# Patient Record
Sex: Male | Born: 1975 | Race: Black or African American | Hispanic: No | Marital: Single | Smoking: Current every day smoker
Health system: Southern US, Community
[De-identification: ages and names within clinical notes are randomized; demographics above are authoritative.]

## PROBLEM LIST (undated history)

## (undated) DIAGNOSIS — E119 Type 2 diabetes mellitus without complications: Secondary | ICD-10-CM

## (undated) DIAGNOSIS — I10 Essential (primary) hypertension: Secondary | ICD-10-CM

## (undated) HISTORY — DX: Essential (primary) hypertension: I10

## (undated) HISTORY — DX: Type 2 diabetes mellitus without complications: E11.9

---

## 2005-10-15 ENCOUNTER — Emergency Department: Payer: Self-pay | Admitting: Emergency Medicine

## 2005-10-15 ENCOUNTER — Other Ambulatory Visit: Payer: Self-pay

## 2008-05-06 DIAGNOSIS — E1159 Type 2 diabetes mellitus with other circulatory complications: Secondary | ICD-10-CM | POA: Insufficient documentation

## 2008-09-26 ENCOUNTER — Ambulatory Visit: Payer: Self-pay | Admitting: Family Medicine

## 2011-08-26 ENCOUNTER — Ambulatory Visit: Payer: Self-pay | Admitting: Family Medicine

## 2013-01-29 ENCOUNTER — Emergency Department: Payer: Self-pay | Admitting: Emergency Medicine

## 2014-03-17 ENCOUNTER — Ambulatory Visit: Payer: Self-pay | Admitting: Family Medicine
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
## 2015-06-13 ENCOUNTER — Other Ambulatory Visit: Payer: Self-pay | Admitting: Family Medicine

## 2015-06-13 ENCOUNTER — Telehealth: Payer: Self-pay | Admitting: Family Medicine

## 2015-06-13 DIAGNOSIS — I10 Essential (primary) hypertension: Secondary | ICD-10-CM

## 2015-06-13 MED ORDER — CHLORTHALIDONE 25 MG PO TABS: 25.0000 mg | ORAL_TABLET | Freq: Every day | ORAL | Status: DC

## 2015-06-13 NOTE — Telephone Encounter (Signed)
Patient is out of chlorthalidone but cannot come in until Friday, July 8.   Says he cannot go 1 week without his meds.    [facility] [place]

## 2015-07-07 ENCOUNTER — Encounter: Payer: Self-pay | Admitting: Family Medicine

## 2015-07-07 ENCOUNTER — Ambulatory Visit (INDEPENDENT_AMBULATORY_CARE_PROVIDER_SITE_OTHER): Payer: No Typology Code available for payment source | Admitting: Family Medicine

## 2015-07-07 VITALS — BP 120/88 | HR 84 | Temp 98.6°F | Resp 16 | Ht 72.0 in | Wt 284.8 lb

## 2015-07-07 DIAGNOSIS — R739 Hyperglycemia, unspecified: Secondary | ICD-10-CM

## 2015-07-07 DIAGNOSIS — E119 Type 2 diabetes mellitus without complications: Secondary | ICD-10-CM | POA: Diagnosis not present

## 2015-07-07 DIAGNOSIS — M2550 Pain in unspecified joint: Secondary | ICD-10-CM

## 2015-07-07 DIAGNOSIS — R519 Headache, unspecified: Secondary | ICD-10-CM | POA: Insufficient documentation

## 2015-07-07 DIAGNOSIS — F4321 Adjustment disorder with depressed mood: Secondary | ICD-10-CM | POA: Diagnosis not present

## 2015-07-07 DIAGNOSIS — Z13220 Encounter for screening for lipoid disorders: Secondary | ICD-10-CM

## 2015-07-07 DIAGNOSIS — Z22322 Carrier or suspected carrier of Methicillin resistant Staphylococcus aureus: Secondary | ICD-10-CM | POA: Insufficient documentation

## 2015-07-07 DIAGNOSIS — R51 Headache: Secondary | ICD-10-CM

## 2015-07-07 DIAGNOSIS — I10 Essential (primary) hypertension: Secondary | ICD-10-CM

## 2015-07-07 DIAGNOSIS — E1165 Type 2 diabetes mellitus with hyperglycemia: Secondary | ICD-10-CM | POA: Insufficient documentation

## 2015-07-07 LAB — POCT GLYCOSYLATED HEMOGLOBIN (HGB A1C): Hemoglobin A1C: 7.1

## 2015-07-07 MED ORDER — METFORMIN HCL 500 MG PO TABS: 500.0000 mg | ORAL_TABLET | Freq: Two times a day (BID) | ORAL | Status: DC

## 2015-07-07 MED ORDER — LISINOPRIL 20 MG PO TABS: 20.0000 mg | ORAL_TABLET | Freq: Every day | ORAL | Status: DC

## 2015-07-07 MED ORDER — IBUPROFEN 800 MG PO TABS: ORAL_TABLET | ORAL | Status: DC

## 2015-07-07 MED ORDER — CHLORTHALIDONE 25 MG PO TABS: 25.0000 mg | ORAL_TABLET | Freq: Every day | ORAL | Status: DC

## 2015-07-07 MED ORDER — SERTRALINE HCL 50 MG PO TABS: 50.0000 mg | ORAL_TABLET | Freq: Every day | ORAL | Status: DC

## 2015-07-07 NOTE — Patient Instructions (Signed)
Start with metformin first for the first few days then add sertraline. Lab slip is down in the lab for when you are ready to get fasting labs.

## 2015-07-07 NOTE — Progress Notes (Signed)
Subjective:     Patient ID: Melvin Mccormick, male   DOB: [birth year], 39 y.o.   MRN: [id number]  HPI  Chief Complaint  Patient presents with  . Hypertension    Follow up from 03/03/14, restarted patient on Chlorthalidone  po qd and Lisinopril . Blood pressure in house at last visit was 152/88.  . Diabetes    Follow up from 03/03/14 HgbA1C in house was 6.5% recommend Metformin BID  States his grandfather died 4 weeks ago and he is grieving. Also states he is stressed from work as he has little time off. Reports increased irritability.   Review of Systems  Constitutional: Positive for unexpected weight change (16# since January 2016 visit).  Respiratory: Negative for shortness of breath.   Cardiovascular: Negative for chest pain and palpitations.  Endocrine:       A1C at office visit last year 6.5%. Patient wished to try Lifestyle changes first.       Objective:   Physical Exam  Constitutional: He appears well-developed and well-nourished. He appears distressed (sad and irritable).  Cardiovascular: Normal rate and regular rhythm.   Pulmonary/Chest: Breath sounds normal.  Musculoskeletal: He exhibits no edema (in lower extremities).       Assessment:    1. Benign essential HTN - lisinopril (PRINIVIL,ZESTRIL) 20 MG tablet; Take 1 tablet (20 mg total) by mouth daily.  Dispense: 90 tablet; Refill: 3 - chlorthalidone (HYGROTON) 25 MG tablet; Take 1 tablet (25 mg total) by mouth daily.  Dispense: 90 tablet; Refill: 3 - Comprehensive metabolic panel  2. Blood glucose elevated - POCT glycosylated hemoglobin (Hb A1C)  3. Screening for cholesterol level - Lipid panel  4. Adjustment disorder with depressed mood - sertraline (ZOLOFT) 50 MG tablet; Take 1 tablet (50 mg total) by mouth daily. Take 1/2 pill for the first 5 days  Dispense: 30 tablet; Refill: 1  5. Joint pain: due to heavy lifting at work  ibuprofen (ADVIL,MOTRIN) 800 MG tablet; 3 X DAILY WITH A MEAL AS NEEDED FOR  JOINT PAIN  Dispense: 30 tablet; Refill: 1  6.Type 2 diabetes mellitus without complication - metFORMIN (GLUCOPHAGE) 500 MG tablet; Take 1 tablet (500 mg total) by mouth 2 (two) times daily with a meal.  Dispense: 60 tablet; Refill: 1     Plan:    Return in 4 weeks. Discussed starting metformin first then sertraline.

## 2015-07-14 ENCOUNTER — Ambulatory Visit: Payer: Self-pay | Admitting: Family Medicine

## 2015-07-28 ENCOUNTER — Ambulatory Visit: Payer: No Typology Code available for payment source | Admitting: Family Medicine

## 2015-08-28 ENCOUNTER — Telehealth: Payer: Self-pay | Admitting: Family Medicine

## 2015-08-28 NOTE — Telephone Encounter (Signed)
Patient called in stating that since he has started Metformin he has had lower back pain around his kidney area.    He wants to d/c this for 4 days to see if this is the cause of pain.    Please advise pt.

## 2015-08-28 NOTE — Telephone Encounter (Signed)
He can try that but I think it is unlikely to be the cause of his back pain.

## 2015-08-28 NOTE — Telephone Encounter (Signed)
Unable to reach patient phone number is disconnected, unable to find another number in chart. KW

## 2015-09-19 ENCOUNTER — Other Ambulatory Visit: Payer: Self-pay | Admitting: Family Medicine

## 2015-12-14 ENCOUNTER — Other Ambulatory Visit: Payer: Self-pay | Admitting: Family Medicine

## 2015-12-14 ENCOUNTER — Telehealth: Payer: Self-pay | Admitting: Family Medicine

## 2015-12-14 DIAGNOSIS — J019 Acute sinusitis, unspecified: Secondary | ICD-10-CM

## 2015-12-14 MED ORDER — AMOXICILLIN-POT CLAVULANATE 875-125 MG PO TABS: 1.0000 | ORAL_TABLET | Freq: Two times a day (BID) | ORAL | Status: DC

## 2015-12-14 NOTE — Telephone Encounter (Signed)
If bp under control may use Mucinex D. For cough: Delsym. I don't usually call in antibiotics without seeing the patient but will make an exception this time. Have sent in [facility] to [facility] to cover for possible sinus infection. If not improving will need to see us or go to  Urgent Care.

## 2015-12-14 NOTE — Telephone Encounter (Signed)
Patient called in stating he has had a sore throat X 2 weeks, nasal congestion, feels bad.  Been taking Alka Seltzer and nyquil at night with no relief.  Patient's insurance has changed to where he has to pay $5000.00 out of pocket and cannot afford to come in.   Please call him with advice.  [phone].

## 2015-12-14 NOTE — Telephone Encounter (Signed)
Patient advised as directed below. Patient verbalized understanding.  

## 2016-05-10 ENCOUNTER — Ambulatory Visit (INDEPENDENT_AMBULATORY_CARE_PROVIDER_SITE_OTHER): Payer: 59 | Admitting: Family Medicine

## 2016-05-10 ENCOUNTER — Encounter: Payer: Self-pay | Admitting: Family Medicine

## 2016-05-10 VITALS — BP 142/98 | HR 99 | Temp 98.2°F | Resp 18 | Wt 293.0 lb

## 2016-05-10 DIAGNOSIS — R05 Cough: Secondary | ICD-10-CM

## 2016-05-10 DIAGNOSIS — R12 Heartburn: Secondary | ICD-10-CM | POA: Diagnosis not present

## 2016-05-10 DIAGNOSIS — R059 Cough, unspecified: Secondary | ICD-10-CM

## 2016-05-10 NOTE — Patient Instructions (Signed)
Start Dexilant (#10) in the morning. In the evening take an antacid combined with Pepcid (famotidine). Followup when you are better for your blood pressure and sugar check.

## 2016-05-10 NOTE — Progress Notes (Signed)
Subjective:     Patient ID: Melvin Mccormick, male   DOB: 01/09/1976, 40 y.o.   MRN: [id number]  HPI  Chief Complaint  Patient presents with  . Cough    for about 4 days. sometimes productive  States he developed cough over the last 5 days in the absence of cold or allergy sx. No fever or chills nor does he feels sick. States cough makes his throat sore-coughing both day and night. Reports he continues to use smokeless tobacco and had heartburn last week. Weight is up 10# since office visit in July. Reports he is no longer on metformin and changed his eating habits.   Review of Systems     Objective:   Physical Exam  Constitutional: He appears well-developed and well-nourished.  Ears: T.M's intact without inflammation Throat: no tonsillar enlargement or exudate Neck: no cervical adenopathy Lungs: clear     Assessment:    1. Cough: ? Mediated by nocturnal reflux.  2. Heartburn    Plan:   Start Dexilant 60 mg. (#10) in the AM and Pepcid/antacid at bedtime. F/u of elevated sugar and bp when better from current sx.

## 2016-07-21 ENCOUNTER — Other Ambulatory Visit: Payer: Self-pay | Admitting: Family Medicine

## 2016-07-21 DIAGNOSIS — I10 Essential (primary) hypertension: Secondary | ICD-10-CM

## 2016-11-18 ENCOUNTER — Other Ambulatory Visit: Payer: Self-pay | Admitting: Family Medicine

## 2016-11-18 DIAGNOSIS — I10 Essential (primary) hypertension: Secondary | ICD-10-CM

## 2017-09-01 ENCOUNTER — Telehealth: Payer: Self-pay | Admitting: Family Medicine

## 2017-09-01 NOTE — Telephone Encounter (Signed)
Mr. Siegel called wanting to get an appt. With you for DM and HBP.    He said he knew you would want labs done also.  He does not have ins. And wanted to know the cost of the labs before he came in.  Can you tell me what labs you would initially order so I can give him the price or an estimated amount?

## 2017-09-01 NOTE — Telephone Encounter (Signed)
I would do an in-house A1C and a lab slip for Comprehensive metabolic profile and lipid profile.

## 2017-09-02 NOTE — Telephone Encounter (Signed)
Patient advised.

## 2017-09-04 ENCOUNTER — Ambulatory Visit (INDEPENDENT_AMBULATORY_CARE_PROVIDER_SITE_OTHER): Payer: Self-pay | Admitting: Family Medicine

## 2017-09-04 ENCOUNTER — Encounter: Payer: Self-pay | Admitting: Family Medicine

## 2017-09-04 VITALS — BP 190/130 | HR 89 | Temp 98.2°F | Resp 17 | Wt 288.8 lb

## 2017-09-04 DIAGNOSIS — I10 Essential (primary) hypertension: Secondary | ICD-10-CM

## 2017-09-04 DIAGNOSIS — E119 Type 2 diabetes mellitus without complications: Secondary | ICD-10-CM

## 2017-09-04 LAB — LIPID PANEL
CHOL/HDL RATIO: 5 (calc) — AB (ref <5.0)
Cholesterol: 119 mg/dL (ref <200)
HDL: 24 mg/dL — AB (ref >40)
LDL Cholesterol (Calc): 66 mg/dL (calc)
NON-HDL CHOLESTEROL (CALC): 95 mg/dL (ref <130)
TRIGLYCERIDES: 233 mg/dL — AB (ref <150)

## 2017-09-04 LAB — COMPREHENSIVE METABOLIC PANEL
AG Ratio: 1.4 (calc) (ref 1.0–2.5)
ALT: 34 U/L (ref 9–46)
AST: 19 U/L (ref 10–40)
Albumin: 4.1 g/dL (ref 3.6–5.1)
Alkaline phosphatase (APISO): 89 U/L (ref 40–115)
BUN: 15 mg/dL (ref 7–25)
CO2: 26 mmol/L (ref 20–32)
CREATININE: 1.01 mg/dL (ref 0.60–1.35)
Calcium: 9.3 mg/dL (ref 8.6–10.3)
Chloride: 100 mmol/L (ref 98–110)
GLUCOSE: 254 mg/dL — AB (ref 65–99)
Globulin: 2.9 g/dL (calc) (ref 1.9–3.7)
Potassium: 4.4 mmol/L (ref 3.5–5.3)
Sodium: 136 mmol/L (ref 135–146)
Total Bilirubin: 0.4 mg/dL (ref 0.2–1.2)
Total Protein: 7 g/dL (ref 6.1–8.1)

## 2017-09-04 LAB — POCT GLYCOSYLATED HEMOGLOBIN (HGB A1C): Hemoglobin A1C: 8.8

## 2017-09-04 MED ORDER — LISINOPRIL 20 MG PO TABS: 20.0000 mg | ORAL_TABLET | Freq: Every day | ORAL | 0 refills | Status: DC

## 2017-09-04 MED ORDER — CHLORTHALIDONE 25 MG PO TABS: 25.0000 mg | ORAL_TABLET | Freq: Every day | ORAL | 1 refills | Status: DC

## 2017-09-04 MED ORDER — GLIPIZIDE 5 MG PO TABS: ORAL_TABLET | ORAL | 1 refills | Status: DC

## 2017-09-04 NOTE — Addendum Note (Signed)
Addended by: Jaclyn Prime on: 09/04/2017 09:01 AM   Modules accepted: Orders

## 2017-09-04 NOTE — Patient Instructions (Addendum)
Let me know if you can't tolerate the medications. Please return in two weeks. We will call you with the lab results.

## 2017-09-04 NOTE — Progress Notes (Addendum)
Subjective:     Patient ID: Melvin Mccormick, male   DOB: 1976-07-23, 41 y.o.   MRN: [id number]  HPI  Chief Complaint  Patient presents with  . Hypertension    Patient returns for follow up visit from 07/07/15 blood pressure at [facility] was 120/88, patient was advised to continue Lisinopril  and Chlorthalidone  qd. Patient reports poor compliance on medication,  . Hyperglycemia    Follow up from 07/07/15, HgbA1C was 7.1%, patient reports poor diet and exercise.   States except for his toes tingling has not had any diabetic sx. 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 He reports he has no health insurance and has financial stress trying to build his business. Did not call for refill of his bp medications. States metformin made him feel "loopy" and "drugged up".   Review of Systems  Respiratory: Negative for shortness of breath.   Cardiovascular: Negative for chest pain and palpitations.  Neurological: Negative for headaches.       Objective:   Physical Exam  Constitutional: He appears well-nourished. No distress.  Cardiovascular: Normal rate and regular rhythm.   Pulmonary/Chest: Breath sounds normal.  Musculoskeletal: He exhibits no edema (of lower extremities).       Assessment:    1. Diabetes mellitus without complication (HCC) - glipiZIDE (GLUCOTROL) 5 MG tablet; Take one tablet 30 minutes before the two biggest meals of the day  Dispense: 60 tablet; Refill: 1 - Lipid panel -POCT A1C 2. Benign essential HTN - lisinopril (PRINIVIL,ZESTRIL) 20 MG tablet; Take 1 tablet (20 mg total) by mouth daily.  Dispense: 30 tablet; Refill: 0 - chlorthalidone (HYGROTON) 25 MG tablet; Take 1 tablet (25 mg total) by mouth daily.  Dispense: 30 tablet; Refill: 1 - Comprehensive metabolic panel    Plan:    Further f/u in two weeks and pending lab work.

## 2017-09-05 ENCOUNTER — Telehealth: Payer: Self-pay

## 2017-09-05 NOTE — Telephone Encounter (Signed)
-----   Message from Anola Gurney, [place] sent at 09/05/2017  7:31 AM EDT ----- Your sugar is high as we discussed but the rest of your labs are pretty good. We can discuss further at next office visit.

## 2017-09-05 NOTE — Telephone Encounter (Signed)
lmtcb

## 2017-09-11 NOTE — Telephone Encounter (Signed)
Patient advised.KW 

## 2017-09-25 ENCOUNTER — Encounter: Payer: Self-pay | Admitting: Family Medicine

## 2017-09-25 ENCOUNTER — Ambulatory Visit (INDEPENDENT_AMBULATORY_CARE_PROVIDER_SITE_OTHER): Payer: Self-pay | Admitting: Family Medicine

## 2017-09-25 VITALS — BP 132/104 | HR 99 | Temp 98.1°F | Resp 16 | Wt 283.8 lb

## 2017-09-25 DIAGNOSIS — E119 Type 2 diabetes mellitus without complications: Secondary | ICD-10-CM

## 2017-09-25 DIAGNOSIS — I10 Essential (primary) hypertension: Secondary | ICD-10-CM

## 2017-09-25 LAB — GLUCOSE, POCT (MANUAL RESULT ENTRY): POC GLUCOSE: 208 mg/dL — AB (ref 70–99)

## 2017-09-25 MED ORDER — LISINOPRIL 20 MG PO TABS: 20.0000 mg | ORAL_TABLET | Freq: Every day | ORAL | 5 refills | Status: DC

## 2017-09-25 MED ORDER — GLIPIZIDE ER 10 MG PO TB24: 10.0000 mg | ORAL_TABLET | Freq: Every day | ORAL | 1 refills | Status: DC

## 2017-09-25 MED ORDER — CHLORTHALIDONE 25 MG PO TABS: 25.0000 mg | ORAL_TABLET | Freq: Every day | ORAL | 5 refills | Status: DC

## 2017-09-25 NOTE — Progress Notes (Signed)
Subjective:     Patient ID: Melvin Mccormick, male   DOB: 30-Aug-1976, 41 y.o.   MRN: [id number]  HPI  Chief Complaint  Patient presents with  . Diabetes    Patient returns for 2 week follow up from 09/04/17 HgbA1C was 8.8% and patient was advised to continue Glipizide . Patient reports that he has been working on dietary changes and has good compliance on medication.  . Hypertension    Patient returns for 2 week follow up last office visit was 09/04/17 and blood pressure in house was 190/130. Patient has continued Lisinopril  and continue Chlorthalidone , patient reports fair compliance and good tolerance on medication.   States he has trouble taking both doses of glipizide due to his schedule. Wishes to try once daily dosing. States he has not eaten today yet. States he is still adjusting to the break up of a 12 year relationship. Denies suicidal plan.   Review of Systems     Objective:   Physical Exam  Constitutional: He appears well-developed and well-nourished. No distress.  Cardiovascular: Normal rate and regular rhythm.   Pulmonary/Chest: Breath sounds normal.       Assessment:    1. Diabetes mellitus without complication [facility]): will change to XL version for better compliance - POCT glucose (manual entry) - glipiZIDE (GLUCOTROL XL) 10 MG 24 hr tablet; Take 1 tablet (10 mg total) by mouth daily with breakfast. Take one pill 30 minutes before the biggest meal of the day  Dispense: 30 tablet; Refill: 1  2. Benign essential HTN: will allow current dose of medication a few more weeks before adding additional medication. - lisinopril (PRINIVIL,ZESTRIL) 20 MG tablet; Take 1 tablet (20 mg total) by mouth daily.  Dispense: 30 tablet; Refill: 5 - chlorthalidone (HYGROTON) 25 MG tablet; Take 1 tablet (25 mg total) by mouth daily.  Dispense: 30 tablet; Refill: 5    Plan:    Discussed walking program and taking diabetes medication 30 minutes before the biggest meal of the day.

## 2017-09-25 NOTE — Patient Instructions (Signed)
Discussed walking 30 minutes daily. Take the diabetes pill 30 minutes before the biggest meal of the day.

## 2017-10-16 ENCOUNTER — Ambulatory Visit: Payer: Self-pay | Admitting: Family Medicine

## 2017-10-27 ENCOUNTER — Other Ambulatory Visit: Payer: Self-pay | Admitting: Family Medicine

## 2017-10-27 DIAGNOSIS — E119 Type 2 diabetes mellitus without complications: Secondary | ICD-10-CM

## 2017-10-30 ENCOUNTER — Ambulatory Visit: Payer: Self-pay | Admitting: Family Medicine

## 2017-11-03 ENCOUNTER — Other Ambulatory Visit: Payer: Self-pay | Admitting: Family Medicine

## 2017-11-03 ENCOUNTER — Telehealth: Payer: Self-pay | Admitting: Family Medicine

## 2017-11-03 DIAGNOSIS — E119 Type 2 diabetes mellitus without complications: Secondary | ICD-10-CM

## 2017-11-03 MED ORDER — GLIPIZIDE ER 10 MG PO TB24: 10.0000 mg | ORAL_TABLET | Freq: Every day | ORAL | 2 refills | Status: DC

## 2017-11-03 NOTE — Telephone Encounter (Signed)
refilled 

## 2017-11-03 NOTE — Telephone Encounter (Signed)
Please review. Thanks!  

## 2017-11-03 NOTE — Telephone Encounter (Signed)
[facility] [address] [place] faxed refill request for following medications: glipiZIDE (GLUCOTROL XL) 10 MG 24 hr tablet    Please advise,Thanks 

## 2017-11-04 ENCOUNTER — Encounter: Payer: Self-pay | Admitting: Family Medicine

## 2017-11-04 ENCOUNTER — Ambulatory Visit: Payer: Self-pay | Admitting: Family Medicine

## 2017-11-04 VITALS — BP 134/88 | HR 99 | Temp 98.6°F | Resp 16 | Wt 287.4 lb

## 2017-11-04 DIAGNOSIS — M25511 Pain in right shoulder: Secondary | ICD-10-CM

## 2017-11-04 DIAGNOSIS — E119 Type 2 diabetes mellitus without complications: Secondary | ICD-10-CM

## 2017-11-04 DIAGNOSIS — G8929 Other chronic pain: Secondary | ICD-10-CM

## 2017-11-04 DIAGNOSIS — I10 Essential (primary) hypertension: Secondary | ICD-10-CM

## 2017-11-04 LAB — GLUCOSE, POCT (MANUAL RESULT ENTRY): POC Glucose: 260 mg/dl — AB (ref 70–99)

## 2017-11-04 MED ORDER — SITAGLIPTIN PHOSPHATE 100 MG PO TABS: 100.0000 mg | ORAL_TABLET | Freq: Every day | ORAL | 1 refills | Status: DC

## 2017-11-04 NOTE — Patient Instructions (Signed)
Try the Good Rx card for the best prices on medication.
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
## 2017-12-05 ENCOUNTER — Ambulatory Visit: Payer: Self-pay | Admitting: Family Medicine

## 2017-12-31 ENCOUNTER — Ambulatory Visit: Payer: Self-pay | Admitting: Family Medicine

## 2018-02-26 ENCOUNTER — Other Ambulatory Visit: Payer: Self-pay | Admitting: Family Medicine

## 2018-02-26 ENCOUNTER — Telehealth: Payer: Self-pay | Admitting: Family Medicine

## 2018-02-26 DIAGNOSIS — E119 Type 2 diabetes mellitus without complications: Secondary | ICD-10-CM

## 2018-02-26 DIAGNOSIS — I10 Essential (primary) hypertension: Secondary | ICD-10-CM

## 2018-02-26 MED ORDER — CHLORTHALIDONE 25 MG PO TABS
25.0000 mg | ORAL_TABLET | Freq: Every day | ORAL | 5 refills | Status: DC
Start: 2018-02-26 — End: 2018-10-12

## 2018-02-26 MED ORDER — GLIPIZIDE ER 10 MG PO TB24: 10.0000 mg | ORAL_TABLET | Freq: Every day | ORAL | 2 refills | Status: DC

## 2018-02-26 MED ORDER — LISINOPRIL 20 MG PO TABS: 20.0000 mg | ORAL_TABLET | Freq: Every day | ORAL | 5 refills | Status: DC

## 2018-02-26 NOTE — Telephone Encounter (Signed)
done

## 2018-02-26 NOTE — Telephone Encounter (Signed)
Patient needs refills on Glipizide 10 mg. HCTZ 25 mg., Lisinopril 20 mg. [facility] [place]

## 2018-05-05 ENCOUNTER — Other Ambulatory Visit: Payer: Self-pay | Admitting: Family Medicine

## 2018-05-05 ENCOUNTER — Telehealth: Payer: Self-pay | Admitting: Family Medicine

## 2018-05-05 DIAGNOSIS — I10 Essential (primary) hypertension: Secondary | ICD-10-CM

## 2018-05-05 MED ORDER — LISINOPRIL 20 MG PO TABS: 20.0000 mg | ORAL_TABLET | Freq: Every day | ORAL | 5 refills | Status: DC

## 2018-05-05 NOTE — Telephone Encounter (Signed)
Patient needs refills on Lisinopril 20 mg. [facility] [place]

## 2018-05-05 NOTE — Telephone Encounter (Signed)
done

## 2018-10-12 ENCOUNTER — Other Ambulatory Visit: Payer: Self-pay | Admitting: Family Medicine

## 2018-10-12 ENCOUNTER — Telehealth: Payer: Self-pay | Admitting: Family Medicine

## 2018-10-12 DIAGNOSIS — E119 Type 2 diabetes mellitus without complications: Secondary | ICD-10-CM

## 2018-10-12 DIAGNOSIS — I10 Essential (primary) hypertension: Secondary | ICD-10-CM

## 2018-10-12 MED ORDER — CHLORTHALIDONE 25 MG PO TABS: 25.0000 mg | ORAL_TABLET | Freq: Every day | ORAL | 5 refills | Status: DC

## 2018-10-12 MED ORDER — GLIPIZIDE ER 10 MG PO TB24: 10.0000 mg | ORAL_TABLET | Freq: Every day | ORAL | 5 refills | Status: DC

## 2018-10-12 MED ORDER — LISINOPRIL 20 MG PO TABS: 20.0000 mg | ORAL_TABLET | Freq: Every day | ORAL | 5 refills | Status: DC

## 2018-10-12 NOTE — Telephone Encounter (Signed)
Patients last office visit 11/04/17, prescription last filled 02/26/18. Please review. KW

## 2018-10-12 NOTE — Telephone Encounter (Signed)
Patient wants to get all 3 of his medicines at one time so he dont have to go back and forth to drug store.  Chlorthalidone 25 mg Glipizide 10 mg. Lisinopril 20 mg.   [facility] [place]

## 2018-10-12 NOTE — Telephone Encounter (Signed)
done

## 2019-01-25 ENCOUNTER — Ambulatory Visit: Payer: Self-pay | Admitting: Family Medicine

## 2019-01-25 ENCOUNTER — Encounter: Payer: Self-pay | Admitting: Family Medicine

## 2019-01-25 VITALS — BP 190/108 | HR 124 | Temp 101.5°F | Resp 18 | Wt 292.6 lb

## 2019-01-25 DIAGNOSIS — B349 Viral infection, unspecified: Secondary | ICD-10-CM

## 2019-01-25 LAB — POC INFLUENZA A&B (BINAX/QUICKVUE)
Influenza A, POC: NEGATIVE
Influenza B, POC: NEGATIVE

## 2019-01-25 MED ORDER — OSELTAMIVIR PHOSPHATE 75 MG PO CAPS: 75.0000 mg | ORAL_CAPSULE | Freq: Two times a day (BID) | ORAL | 0 refills | Status: DC

## 2019-01-25 MED ORDER — HYDROCODONE-HOMATROPINE 5-1.5 MG/5ML PO SYRP: ORAL_SOLUTION | ORAL | 0 refills | Status: DC

## 2019-01-25 NOTE — Progress Notes (Signed)
  Subjective:     Patient ID: Melvin Mccormick, male   DOB: Jan 20, 1976, 43 y.o.   MRN: [id number] Chief Complaint  Patient presents with  . URI    Patient comes into office today with coplaints of cough and chest congestion for the past 24hrs. Patient reports headache, dizziness, fatigue and sinus pressure. Patient has tried otc Mucinex and Tylenol.    HPI States he had a mild cough yesterday AM then developed more symptoms this AM. Took Dayquil and/or Nyquil last yesterday.Reports compliance with bp medication.  Review of Systems     Objective:   Physical Exam Constitutional:      General: He is not in acute distress.    Appearance: He is ill-appearing.  Neurological:     Mental Status: He is alert.   Ears: T.M's intact without inflammation Throat: no tonsillar enlargement or exudate Neck: no cervical adenopathy Lungs: clear     Assessment:    1. Viral syndrome: will treat as flu - POC Influenza A&B(BINAX/QUICKVUE) - oseltamivir (TAMIFLU) 75 MG capsule; Take 1 capsule (75 mg total) by mouth 2 (two) times daily.  Dispense: 10 capsule; Refill: 0 - HYDROcodone-homatropine (HYCODAN) 5-1.5 MG/5ML syrup; 5 ml 4-6 hours as needed for cough  Dispense: 100 mL; Refill: 0    Plan:    Increase lisinopril to two pills daily. Discussed use of saline spray and Mucinex. Will return to the office when better for f/u of bp and diabetes.

## 2019-01-25 NOTE — Patient Instructions (Addendum)
Do increase your lisinopril to two pills daily. Follow up in the office for bp and sugar check when feeling better. Discussed use of Mucinex and prescription cough syrup.Salt water spray for sinus congestion.

## 2019-03-16 ENCOUNTER — Telehealth: Payer: Self-pay | Admitting: Physician Assistant

## 2019-03-16 DIAGNOSIS — E119 Type 2 diabetes mellitus without complications: Secondary | ICD-10-CM

## 2019-03-16 DIAGNOSIS — I10 Essential (primary) hypertension: Secondary | ICD-10-CM

## 2019-03-16 MED ORDER — LISINOPRIL 20 MG PO TABS: 20.0000 mg | ORAL_TABLET | Freq: Every day | ORAL | 1 refills | Status: DC

## 2019-03-16 MED ORDER — GLIPIZIDE ER 10 MG PO TB24: 10.0000 mg | ORAL_TABLET | Freq: Every day | ORAL | 1 refills | Status: DC

## 2019-03-16 NOTE — Telephone Encounter (Signed)
Two months sent in. He needs to re-establish with new provider. Has not had A1c checked since 2018.

## 2019-03-16 NOTE — Telephone Encounter (Signed)
Patient reports he will call back to schedule appt.

## 2019-03-16 NOTE — Telephone Encounter (Signed)
Please review

## 2019-03-16 NOTE — Telephone Encounter (Signed)
Bob's patient.  He is out of Glipizide 10 mg. And Lisinopril 20 mg.  [facility] [place].

## 2019-04-17 ENCOUNTER — Other Ambulatory Visit: Payer: Self-pay | Admitting: Physician Assistant

## 2019-04-17 DIAGNOSIS — I10 Essential (primary) hypertension: Secondary | ICD-10-CM

## 2019-04-19 NOTE — Telephone Encounter (Signed)
Pt will need BP f/u since lisinopril was on backorder and had to change to benazeprl in 4-6 weeks.

## 2019-04-21 NOTE — Telephone Encounter (Signed)
Patient advised as directed below. He is going to call the office to schedule.

## 2019-05-21 ENCOUNTER — Other Ambulatory Visit: Payer: Self-pay | Admitting: Physician Assistant

## 2019-05-21 DIAGNOSIS — E119 Type 2 diabetes mellitus without complications: Secondary | ICD-10-CM

## 2019-05-21 NOTE — Telephone Encounter (Signed)
Needs appt

## 2019-06-10 ENCOUNTER — Other Ambulatory Visit: Payer: Self-pay | Admitting: Physician Assistant

## 2019-06-10 DIAGNOSIS — E119 Type 2 diabetes mellitus without complications: Secondary | ICD-10-CM

## 2019-06-15 NOTE — Progress Notes (Signed)
Patient: Melvin Mccormick Male    DOB: 03/25/1976   43 y.o.   MRN: [id number] Visit Date: 06/16/2019  Today's Provider: Margaretann LovelessJennifer M Tajae Rybicki, PA-C   Chief Complaint  Patient presents with  . Follow-up    HTN and DM   Subjective:     HPI   Hypertension, follow-up:  BP Readings from Last 3 Encounters:  06/16/19 (!) 144/97  01/25/19 (!) 190/108  11/04/17 134/88    He was last seen for hypertension 2 years ago.  BP at that visit was 134/88. Management since that visit includes none. He reports excellent compliance with treatment. He is not having side effects.  He is exercising. He is not adherent to low salt diet.   Outside blood pressures are n/a. He is experiencing none.  Patient denies chest pain, chest pressure/discomfort, exertional chest pressure/discomfort, fatigue, irregular heart beat, lower extremity edema, near-syncope and palpitations.   Cardiovascular risk factors include diabetes mellitus, hypertension, male gender and smoking/ tobacco exposure.     Weight trend: stable Wt Readings from Last 3 Encounters:  06/16/19 284 lb (128.8 kg)  01/25/19 292 lb 9.6 oz (132.7 kg)  11/04/17 287 lb 6.4 oz (130.4 kg)    Current diet: in general, an "unhealthy" diet  ------------------------------------------------------------------------  Diabetes Mellitus, Follow-up:   Lab Results  Component Value Date   HGBA1C 8.8 09/04/2017   HGBA1C 7.1 07/07/2015    Last seen for diabetes 2 years ago.  Management since then includes start Januvia 100 mg tablet.Patient reports that the only medicines he is on is glipizide and chlorthalidone. He reports excellent compliance with treatment. He is not having side effects.  Current symptoms include tingling/numbness on his feet and have been stable for the past three years. (only on his toe) Home blood sugar records: not checking  Episodes of hypoglycemia? n/a  Most Recent Eye Exam: Is Due  Pertinent Labs:     Component Value Date/Time   CHOL 119 09/04/2017 0909   TRIG 233 (H) 09/04/2017 0909   HDL 24 (L) 09/04/2017 0909   LDLCALC 66 09/04/2017 0909   CREATININE 1.01 09/04/2017 0909     No Known Allergies   Current Outpatient Medications:  .  chlorthalidone (HYGROTON) 25 MG tablet, Take 1 tablet (25 mg total) by mouth daily., Disp: 30 tablet, Rfl: 5 .  glipiZIDE (GLUCOTROL XL) 10 MG 24 hr tablet, TAKE 1 TABLET BY MOUTH DAILY WITH BREAKFAST. TAKE 1 TAB 30 MINS BEFORE THE BIGGEST MEAL OF THE DAY, Disp: 30 tablet, Rfl: 5 .  lisinopril (ZESTRIL) 20 MG tablet, Take 1 tablet (20 mg total) by mouth daily., Disp: 30 tablet, Rfl: 5  Review of Systems  Constitutional: Negative.   HENT: Negative.   Eyes: Negative for visual disturbance.  Respiratory: Negative.   Cardiovascular: Negative.   Gastrointestinal: Negative.   Endocrine: Negative for polydipsia, polyphagia and polyuria.  Neurological: Positive for numbness (toes). Negative for dizziness, weakness, light-headedness and headaches.    Social History   Tobacco Use  . Smoking status: Light Tobacco Smoker  . Smokeless tobacco: Current User    Types: Snuff  . Tobacco comment: occasionally  Substance Use Topics  . Alcohol use: Yes    Alcohol/week: 0.0 standard drinks      Objective:   BP (!) 144/97 (BP Location: Left Arm, Patient Position: Sitting, Cuff Size: Large)   Pulse 95   Temp 98.5 F (36.9 C) (Oral)   Resp 16   Wt 284 lb (  128.8 kg)   BMI 38.52 kg/m  Vitals:   06/16/19 0851  BP: (!) 144/97  Pulse: 95  Resp: 16  Temp: 98.5 F (36.9 C)  TempSrc: Oral  Weight: 284 lb (128.8 kg)     Physical Exam Vitals signs reviewed.  Constitutional:      General: He is not in acute distress.    Appearance: Normal appearance. He is well-developed. He is obese. He is not ill-appearing or diaphoretic.  HENT:     Head: Normocephalic and atraumatic.  Eyes:     General: No scleral icterus.    Extraocular Movements: Extraocular  movements intact.  Neck:     Musculoskeletal: Normal range of motion and neck supple.     Thyroid: No thyromegaly.     Vascular: No JVD.     Trachea: No tracheal deviation.  Cardiovascular:     Rate and Rhythm: Normal rate and regular rhythm.     Pulses: Normal pulses.     Heart sounds: Normal heart sounds. No murmur. No friction rub. No gallop.   Pulmonary:     Effort: Pulmonary effort is normal. No respiratory distress.     Breath sounds: Normal breath sounds. No wheezing or rales.  Musculoskeletal:     Right lower leg: No edema.     Left lower leg: No edema.  Lymphadenopathy:     Cervical: No cervical adenopathy.  Skin:    Capillary Refill: Capillary refill takes less than 2 seconds.  Neurological:     General: No focal deficit present.     Mental Status: He is alert and oriented to person, place, and time. Mental status is at baseline.  Psychiatric:        Mood and Affect: Mood normal.        Behavior: Behavior normal.        Thought Content: Thought content normal.        Judgment: Judgment normal.    Diabetic Foot Exam - Simple   Simple Foot Form Diabetic Foot exam was performed with the following findings: Yes 06/16/2019  1:26 PM  Visual Inspection No deformities, no ulcerations, no other skin breakdown bilaterally: Yes Sensation Testing Intact to touch and monofilament testing bilaterally: Yes Pulse Check Posterior Tibialis and Dorsalis pulse intact bilaterally: Yes Comments      No results found for any visits on 06/16/19.     Assessment & Plan    1. Benign essential HTN Stable. Diagnosis pulled for medication refill. Continue current medical treatment plan. Will check labs as below and f/u pending results. - CBC w/Diff/Platelet - Comprehensive Metabolic Panel (CMET) - TSH - HgB A1c - Lipid Profile - chlorthalidone (HYGROTON) 25 MG tablet; Take 1 tablet (25 mg total) by mouth daily.  Dispense: 30 tablet; Refill: 5 - lisinopril (ZESTRIL) 20 MG tablet;  Take 1 tablet (20 mg total) by mouth daily.  Dispense: 30 tablet; Refill: 5  2. Diabetes mellitus without complication (HCC) Stable. Diagnosis pulled for medication refill. Continue current medical treatment plan. Will check labs as below and f/u pending results. - CBC w/Diff/Platelet - Comprehensive Metabolic Panel (CMET) - TSH - HgB A1c - Lipid Profile - glipiZIDE (GLUCOTROL XL) 10 MG 24 hr tablet; TAKE 1 TABLET BY MOUTH DAILY WITH BREAKFAST. TAKE 1 TAB 30 MINS BEFORE THE BIGGEST MEAL OF THE DAY  Dispense: 30 tablet; Refill: 5  3. Class 2 severe obesity due to excess calories with serious comorbidity and body mass index (BMI) of 38.0 to 38.9 in adult (  [place]) Counseled patient on healthy lifestyle modifications including dieting and exercise.  - CBC w/Diff/Platelet - Comprehensive Metabolic Panel (CMET) - TSH - HgB A1c - Lipid Profile     Mar Daring, PA-C  [facility]

## 2019-06-16 ENCOUNTER — Ambulatory Visit: Payer: Self-pay | Admitting: Physician Assistant

## 2019-06-16 ENCOUNTER — Other Ambulatory Visit: Payer: Self-pay

## 2019-06-16 ENCOUNTER — Encounter: Payer: Self-pay | Admitting: Physician Assistant

## 2019-06-16 VITALS — BP 144/97 | HR 95 | Temp 98.5°F | Resp 16 | Wt 284.0 lb

## 2019-06-16 DIAGNOSIS — E119 Type 2 diabetes mellitus without complications: Secondary | ICD-10-CM

## 2019-06-16 DIAGNOSIS — I10 Essential (primary) hypertension: Secondary | ICD-10-CM

## 2019-06-16 DIAGNOSIS — Z6838 Body mass index (BMI) 38.0-38.9, adult: Secondary | ICD-10-CM

## 2019-06-16 MED ORDER — GLIPIZIDE ER 10 MG PO TB24: ORAL_TABLET | ORAL | 5 refills | Status: DC

## 2019-06-16 MED ORDER — LISINOPRIL 20 MG PO TABS: 20.0000 mg | ORAL_TABLET | Freq: Every day | ORAL | 5 refills | Status: DC

## 2019-06-16 MED ORDER — CHLORTHALIDONE 25 MG PO TABS: 25.0000 mg | ORAL_TABLET | Freq: Every day | ORAL | 5 refills | Status: DC

## 2019-06-16 NOTE — Patient Instructions (Signed)

## 2019-06-17 LAB — CBC WITH DIFFERENTIAL/PLATELET
Basophils Absolute: 0 10*3/uL (ref 0.0–0.2)
Basos: 1 %
EOS (ABSOLUTE): 0.3 10*3/uL (ref 0.0–0.4)
Eos: 7 %
Hematocrit: 45.7 % (ref 37.5–51.0)
Hemoglobin: 15.6 g/dL (ref 13.0–17.7)
Immature Grans (Abs): 0 10*3/uL (ref 0.0–0.1)
Immature Granulocytes: 0 %
Lymphocytes Absolute: 2.2 10*3/uL (ref 0.7–3.1)
Lymphs: 46 %
MCH: 28.9 pg (ref 26.6–33.0)
MCHC: 34.1 g/dL (ref 31.5–35.7)
MCV: 85 fL (ref 79–97)
Monocytes Absolute: 0.4 10*3/uL (ref 0.1–0.9)
Monocytes: 9 %
Neutrophils Absolute: 1.7 10*3/uL (ref 1.4–7.0)
Neutrophils: 37 %
Platelets: 169 10*3/uL (ref 150–450)
RBC: 5.4 x10E6/uL (ref 4.14–5.80)
RDW: 11.1 % — ABNORMAL LOW (ref 11.6–15.4)
WBC: 4.7 10*3/uL (ref 3.4–10.8)

## 2019-06-17 LAB — LIPID PANEL
Chol/HDL Ratio: 4.7 ratio (ref 0.0–5.0)
Cholesterol, Total: 131 mg/dL (ref 100–199)
HDL: 28 mg/dL — ABNORMAL LOW (ref >39)
LDL Calculated: 58 mg/dL (ref 0–99)
Triglycerides: 226 mg/dL — ABNORMAL HIGH (ref 0–149)
VLDL Cholesterol Cal: 45 mg/dL — ABNORMAL HIGH (ref 5–40)

## 2019-06-17 LAB — COMPREHENSIVE METABOLIC PANEL
ALT: 35 IU/L (ref 0–44)
AST: 17 IU/L (ref 0–40)
Albumin/Globulin Ratio: 1.6 (ref 1.2–2.2)
Albumin: 4.6 g/dL (ref 4.0–5.0)
Alkaline Phosphatase: 75 IU/L (ref 39–117)
BUN/Creatinine Ratio: 13 (ref 9–20)
BUN: 13 mg/dL (ref 6–24)
Bilirubin Total: 0.5 mg/dL (ref 0.0–1.2)
CO2: 23 mmol/L (ref 20–29)
Calcium: 9.5 mg/dL (ref 8.7–10.2)
Chloride: 90 mmol/L — ABNORMAL LOW (ref 96–106)
Creatinine, Ser: 0.97 mg/dL (ref 0.76–1.27)
GFR calc Af Amer: 110 mL/min/{1.73_m2} (ref >59)
GFR calc non Af Amer: 95 mL/min/{1.73_m2} (ref >59)
Globulin, Total: 2.9 g/dL (ref 1.5–4.5)
Glucose: 239 mg/dL — ABNORMAL HIGH (ref 65–99)
Potassium: 4.3 mmol/L (ref 3.5–5.2)
Sodium: 132 mmol/L — ABNORMAL LOW (ref 134–144)
Total Protein: 7.5 g/dL (ref 6.0–8.5)

## 2019-06-17 LAB — HEMOGLOBIN A1C
Est. average glucose Bld gHb Est-mCnc: 177 mg/dL
Hgb A1c MFr Bld: 7.8 % — ABNORMAL HIGH (ref 4.8–5.6)

## 2019-06-17 LAB — TSH: TSH: 1.55 u[IU]/mL (ref 0.450–4.500)

## 2019-06-23 ENCOUNTER — Telehealth: Payer: Self-pay

## 2019-06-23 DIAGNOSIS — E119 Type 2 diabetes mellitus without complications: Secondary | ICD-10-CM

## 2019-06-23 MED ORDER — PIOGLITAZONE HCL 15 MG PO TABS: 15.0000 mg | ORAL_TABLET | Freq: Every day | ORAL | 1 refills | Status: DC

## 2019-06-23 NOTE — Telephone Encounter (Signed)
Sent in pioglitazone 15mg 

## 2019-06-23 NOTE — Telephone Encounter (Signed)
Patient advised. He is agreeable to add another medication. He did said that if it is Metformin he is not doing it.

## 2019-06-23 NOTE — Telephone Encounter (Signed)
-----   Message from Mar Daring, [place] sent at 06/22/2019  5:22 PM EDT ----- Blood count is normal. Kidney and liver function is normal. Thyroid is normal. A1c has improved from 8.8 to now 7.8. I would recommend to add another medication to help optimize sugar even more if agreeable. Cholesterol is stable.

## 2019-07-17 ENCOUNTER — Other Ambulatory Visit: Payer: Self-pay | Admitting: Physician Assistant

## 2019-07-17 DIAGNOSIS — I10 Essential (primary) hypertension: Secondary | ICD-10-CM

## 2019-10-13 ENCOUNTER — Other Ambulatory Visit: Payer: Self-pay | Admitting: Physician Assistant

## 2019-10-13 DIAGNOSIS — I10 Essential (primary) hypertension: Secondary | ICD-10-CM

## 2020-01-06 ENCOUNTER — Other Ambulatory Visit: Payer: Self-pay | Admitting: Physician Assistant

## 2020-01-06 DIAGNOSIS — E119 Type 2 diabetes mellitus without complications: Secondary | ICD-10-CM

## 2020-01-06 NOTE — Telephone Encounter (Signed)
Requested medication (s) are due for refill today: yes  Requested medication (s) are on the active medication list: yes  Last refill:  10/10/2019  Future visit scheduled: no  Notes to clinic: no valid encounter within last 6 months   Requested Prescriptions  Pending Prescriptions Disp Refills   pioglitazone (ACTOS) 15 MG tablet [Pharmacy Med Name: PIOGLITAZONE HCL 15 MG TABLET] 90 tablet 1    Sig: TAKE 1 TABLET BY MOUTH EVERY DAY      Endocrinology:  Diabetes - Glitazones - pioglitazone Failed - 01/06/2020  1:30 AM      Failed - HBA1C is between 0 and 7.9 and within 180 days    Hgb A1c MFr Bld  Date Value Ref Range Status  06/16/2019 7.8 (H) 4.8 - 5.6 % Final    Comment:             Prediabetes: 5.7 - 6.4          Diabetes: >6.4          Glycemic control for adults with diabetes: <7.0           Failed - Valid encounter within last 6 months    Recent Outpatient Visits           6 months ago Benign essential HTN   [facility] Joycelyn [place], [place]   11 months ago Viral syndrome   [facility] [place], [place], [place]   2 years ago Diabetes mellitus without complication [facility])   [facility] [place], [place], [place]   2 years ago Diabetes mellitus without complication [facility])   [facility] [place], [place], [place]   2 years ago Diabetes mellitus without complication [facility])   [facility] [place], [place], [place]

## 2020-01-21 ENCOUNTER — Other Ambulatory Visit: Payer: Self-pay | Admitting: Physician Assistant

## 2020-01-21 DIAGNOSIS — E119 Type 2 diabetes mellitus without complications: Secondary | ICD-10-CM

## 2020-02-15 ENCOUNTER — Other Ambulatory Visit: Payer: Self-pay | Admitting: Physician Assistant

## 2020-02-15 DIAGNOSIS — E119 Type 2 diabetes mellitus without complications: Secondary | ICD-10-CM

## 2020-02-15 NOTE — Telephone Encounter (Signed)
Requested medication (s) are due for refill today: yes  Requested medication (s) are on the active medication list: yes  Last refill:  01/21/2020  Future visit scheduled: no  Notes to clinic:  due for labs and follow up   Requested Prescriptions  Pending Prescriptions Disp Refills   glipiZIDE (GLUCOTROL XL) 10 MG 24 hr tablet [Pharmacy Med Name: GLIPIZIDE ER 10 MG TABLET] 30 tablet 0    Sig: TAKE 1 TAB 30 MINS BEFORE THE BIGGEST MEAL OF THE DAY      Endocrinology:  Diabetes - Sulfonylureas Failed - 02/15/2020  1:25 AM      Failed - HBA1C is between 0 and 7.9 and within 180 days    Hgb A1c MFr Bld  Date Value Ref Range Status  06/16/2019 7.8 (H) 4.8 - 5.6 % Final    Comment:             Prediabetes: 5.7 - 6.4          Diabetes: >6.4          Glycemic control for adults with diabetes: <7.0           Failed - Valid encounter within last 6 months    Recent Outpatient Visits           8 months ago Benign essential HTN   [facility] [place], [place]andra [place], [place]   1 year ago Viral syndrome   [facility] [place], [place], [place]   2 years ago Diabetes mellitus without complication [facility])   [facility] [place], [place], [place]   2 years ago Diabetes mellitus without complication [facility])   [facility] [place], [place], [place]   2 years ago Diabetes mellitus without complication [facility])   [facility] [place], [place], [place]

## 2020-03-12 ENCOUNTER — Other Ambulatory Visit: Payer: Self-pay | Admitting: Physician Assistant

## 2020-03-12 DIAGNOSIS — E119 Type 2 diabetes mellitus without complications: Secondary | ICD-10-CM

## 2020-03-28 ENCOUNTER — Other Ambulatory Visit: Payer: Self-pay | Admitting: Physician Assistant

## 2020-03-28 DIAGNOSIS — I10 Essential (primary) hypertension: Secondary | ICD-10-CM

## 2020-04-06 ENCOUNTER — Other Ambulatory Visit: Payer: Self-pay | Admitting: Physician Assistant

## 2020-04-06 DIAGNOSIS — E119 Type 2 diabetes mellitus without complications: Secondary | ICD-10-CM

## 2020-04-06 NOTE — Telephone Encounter (Signed)
Requested  medications are  due for refill today yes  Requested medications are on the active medication list yes  Last refill 1/21  Future visit scheduled no  Last visit 7 months ago  Notes to clinic already had curtesy refill and no visit scheduled

## 2020-05-09 ENCOUNTER — Other Ambulatory Visit: Payer: Self-pay | Admitting: Physician Assistant

## 2020-05-09 DIAGNOSIS — E119 Type 2 diabetes mellitus without complications: Secondary | ICD-10-CM

## 2020-05-09 NOTE — Telephone Encounter (Signed)
Requested medications are due for refill today?  Yes  Requested medications are on active medication list?  Yes  Last Refill:   04/06/2020  # 30 with no refills - Courtesy refill with notation that patent make an appointment for further refills.   Future visit scheduled?  No   Notes to Clinic:  Medication failed RX refill protocol due to no valid encounter in the past 6 months and no Hgb A1C in past 180 days.

## 2020-05-19 ENCOUNTER — Encounter: Payer: Self-pay | Admitting: Physician Assistant

## 2020-05-19 ENCOUNTER — Other Ambulatory Visit: Payer: Self-pay

## 2020-05-19 ENCOUNTER — Ambulatory Visit (INDEPENDENT_AMBULATORY_CARE_PROVIDER_SITE_OTHER): Payer: Self-pay | Admitting: Physician Assistant

## 2020-05-19 VITALS — BP 160/101 | HR 76 | Temp 97.1°F | Resp 16 | Ht 73.0 in | Wt 273.2 lb

## 2020-05-19 DIAGNOSIS — I10 Essential (primary) hypertension: Secondary | ICD-10-CM

## 2020-05-19 DIAGNOSIS — Z6836 Body mass index (BMI) 36.0-36.9, adult: Secondary | ICD-10-CM

## 2020-05-19 DIAGNOSIS — N529 Male erectile dysfunction, unspecified: Secondary | ICD-10-CM

## 2020-05-19 DIAGNOSIS — E781 Pure hyperglyceridemia: Secondary | ICD-10-CM

## 2020-05-19 DIAGNOSIS — E66812 Obesity, class 2: Secondary | ICD-10-CM | POA: Insufficient documentation

## 2020-05-19 DIAGNOSIS — E119 Type 2 diabetes mellitus without complications: Secondary | ICD-10-CM

## 2020-05-19 MED ORDER — BENAZEPRIL HCL 20 MG PO TABS: 20.0000 mg | ORAL_TABLET | Freq: Every day | ORAL | 3 refills | Status: DC

## 2020-05-19 MED ORDER — PIOGLITAZONE HCL 15 MG PO TABS: 15.0000 mg | ORAL_TABLET | Freq: Every day | ORAL | 3 refills | Status: DC

## 2020-05-19 MED ORDER — SILDENAFIL CITRATE 100 MG PO TABS: 100.0000 mg | ORAL_TABLET | Freq: Every day | ORAL | 0 refills | Status: DC | PRN

## 2020-05-19 MED ORDER — GLIPIZIDE ER 10 MG PO TB24: ORAL_TABLET | ORAL | 3 refills | Status: DC

## 2020-05-19 MED ORDER — CHLORTHALIDONE 25 MG PO TABS: 25.0000 mg | ORAL_TABLET | Freq: Every day | ORAL | 3 refills | Status: DC

## 2020-05-19 NOTE — Progress Notes (Signed)
Established patient visit   Patient: Melvin Mccormick   DOB: January 29, 1976   44 y.o. Male  MRN: [id number] Visit Date: 05/19/2020  Today's healthcare provider: Margaretann Loveless, PA-C   Chief Complaint  Patient presents with  . Follow-up    T2DM and HTN   Subjective    HPI Diabetes Mellitus Type II, Follow-up  Lab Results  Component Value Date   HGBA1C 6.3 (H) 05/19/2020   HGBA1C 7.8 (H) 06/16/2019   HGBA1C 8.8 09/04/2017   Wt Readings from Last 3 Encounters:  05/19/20 273 lb 3.2 oz (123.9 kg)  06/16/19 284 lb (128.8 kg)  01/25/19 292 lb 9.6 oz (132.7 kg)   Last seen for diabetes 11 months ago.  Management since then includes Continue current medical treatment plan He reports fair compliance with treatment. He is not having side effects. Reports that he ha staff going on and is hard to remember to take his medicines. Symptoms: No fatigue No foot ulcerations  No appetite changes No nausea  No paresthesia of the feet  No polydipsia  No polyuria No visual disturbances   No vomiting     Home blood sugar records: not being checked  Episodes of hypoglycemia? No not being checked   Current insulin regiment: none Most Recent Eye Exam: not UTD Current exercise: none Current diet habits: in general, an "unhealthy" diet  Pertinent Labs: Lab Results  Component Value Date   CHOL 132 05/19/2020   HDL 34 (L) 05/19/2020   LDLCALC 82 05/19/2020   TRIG 78 05/19/2020   CHOLHDL 4.7 06/16/2019   Lab Results  Component Value Date   NA 136 05/19/2020   K 4.2 05/19/2020   CREATININE 1.01 05/19/2020   GFRNONAA 90 05/19/2020   GFRAA 104 05/19/2020   GLUCOSE 171 (H) 05/19/2020     --------------------------------------------------------------------------------------------------- Hypertension, follow-up  BP Readings from Last 3 Encounters:  05/19/20 (!) 160/101  06/16/19 (!) 144/97  01/25/19 (!) 190/108   Wt Readings from Last 3 Encounters:  05/19/20 273 lb 3.2 oz  (123.9 kg)  06/16/19 284 lb (128.8 kg)  01/25/19 292 lb 9.6 oz (132.7 kg)     He was last seen for hypertension 11 months ago.  BP at that visit was 144/97. Management since that visit includes continue current medication plan.  He reports fair compliance with treatment. He is not having side effects.  He is following a Regular diet. He is not exercising. He does tobacco.  Outside blood pressures are not being checked. Symptoms: No chest pain No chest pressure  No palpitations No syncope  No dyspnea No orthopnea  No paroxysmal nocturnal dyspnea No lower extremity edema   Pertinent labs: Lab Results  Component Value Date   CHOL 132 05/19/2020   HDL 34 (L) 05/19/2020   LDLCALC 82 05/19/2020   TRIG 78 05/19/2020   CHOLHDL 4.7 06/16/2019   Lab Results  Component Value Date   NA 136 05/19/2020   K 4.2 05/19/2020   CREATININE 1.01 05/19/2020   GFRNONAA 90 05/19/2020   GFRAA 104 05/19/2020   GLUCOSE 171 (H) 05/19/2020     The 10-year ASCVD risk score Denman George DC Jr., et al., 2013) is: 28.6%   --------------------------------------------------------------------------------------------------- Patient Active Problem List   Diagnosis Date Noted  . Class 2 severe obesity due to excess calories with serious comorbidity and body mass index (BMI) of 38.0 to 38.9 in adult (HCC) 05/19/2020  . Headache 07/07/2015  . Diabetes mellitus without complication (HCC)  07/07/2015  . 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 Benign essential HTN 05/06/2008   Past Medical History:  Diagnosis Date  . Diabetes mellitus without complication (HCC)   . Hypertension        Medications: Outpatient Medications Prior to Visit  Medication Sig  . [DISCONTINUED] benazepril (LOTENSIN) 20 MG tablet TAKE 1 TABLET BY MOUTH EVERY DAY  . [DISCONTINUED] chlorthalidone (HYGROTON) 25 MG tablet Take 1 tablet (25 mg total) by mouth daily.  . [DISCONTINUED] glipiZIDE  (GLUCOTROL XL) 10 MG 24 hr tablet TAKE 1 TAB 30 MINS BEFORE THE BIGGEST MEAL OF THE DAY.Marland KitchenNeeds office visit before anymore refills.  . [DISCONTINUED] pioglitazone (ACTOS) 15 MG tablet TAKE 1 TABLET BY MOUTH EVERY DAY   No facility-administered medications prior to visit.    Review of Systems  Constitutional: Negative for chills, fatigue and fever.  Eyes: Negative for visual disturbance.  Respiratory: Negative for cough, chest tightness and shortness of breath.   Cardiovascular: Negative for chest pain, palpitations and leg swelling.  Endocrine: Negative for polydipsia, polyphagia and polyuria.  Neurological: Positive for headaches. Negative for dizziness, light-headedness and numbness.    Last CBC Lab Results  Component Value Date   WBC 4.8 05/19/2020   HGB 14.9 05/19/2020   HCT 45.7 05/19/2020   MCV 89 05/19/2020   MCH 29.0 05/19/2020   RDW 11.4 (L) 05/19/2020   PLT 127 (L) 05/19/2020   Last metabolic panel Lab Results  Component Value Date   GLUCOSE 171 (H) 05/19/2020   NA 136 05/19/2020   K 4.2 05/19/2020   CL 102 05/19/2020   CO2 21 05/19/2020   BUN 10 05/19/2020   CREATININE 1.01 05/19/2020   GFRNONAA 90 05/19/2020   GFRAA 104 05/19/2020   CALCIUM 8.8 05/19/2020   PROT 6.8 05/19/2020   ALBUMIN 4.3 05/19/2020   LABGLOB 2.5 05/19/2020   AGRATIO 1.7 05/19/2020   BILITOT 0.4 05/19/2020   ALKPHOS 84 05/19/2020   AST 14 05/19/2020   ALT 23 05/19/2020   Last lipids Lab Results  Component Value Date   CHOL 132 05/19/2020   HDL 34 (L) 05/19/2020   LDLCALC 82 05/19/2020   TRIG 78 05/19/2020   CHOLHDL 4.7 06/16/2019   Last hemoglobin A1c Lab Results  Component Value Date   HGBA1C 6.3 (H) 05/19/2020      Objective    BP (!) 160/101 (BP Location: Left Arm, Patient Position: Sitting, Cuff Size: Large)   Pulse 76   Temp (!) 97.1 F (36.2 C) (Temporal)   Resp 16   Ht 6\' 1"  (1.854 m)   Wt 273 lb 3.2 oz (123.9 kg)   BMI 36.04 kg/m  BP Readings from Last 3  Encounters:  05/19/20 (!) 160/101  06/16/19 (!) 144/97  01/25/19 (!) 190/108   Wt Readings from Last 3 Encounters:  05/19/20 273 lb 3.2 oz (123.9 kg)  06/16/19 284 lb (128.8 kg)  01/25/19 292 lb 9.6 oz (132.7 kg)      Physical Exam Vitals reviewed.  Constitutional:      General: He is not in acute distress.    Appearance: Normal appearance. He is well-developed. He is obese. He is not ill-appearing or diaphoretic.  HENT:     Head: Normocephalic and atraumatic.     Right Ear: Tympanic membrane, ear canal and external ear normal.     Left Ear: Tympanic membrane, ear canal and external ear normal.  Eyes:     General: No scleral icterus.  Extraocular Movements: Extraocular movements intact.     Conjunctiva/sclera: Conjunctivae normal.     Pupils: Pupils are equal, round, and reactive to light.  Neck:     Thyroid: No thyromegaly.     Vascular: No carotid bruit or JVD.     Trachea: No tracheal deviation.  Cardiovascular:     Rate and Rhythm: Normal rate and regular rhythm.     Pulses: Normal pulses.     Heart sounds: Normal heart sounds. No murmur. No friction rub. No gallop.   Pulmonary:     Effort: Pulmonary effort is normal. No respiratory distress.     Breath sounds: Normal breath sounds. No wheezing or rales.  Musculoskeletal:     Cervical back: Normal range of motion and neck supple.     Right lower leg: No edema.     Left lower leg: No edema.  Lymphadenopathy:     Cervical: No cervical adenopathy.  Skin:    Capillary Refill: Capillary refill takes less than 2 seconds.  Neurological:     General: No focal deficit present.     Mental Status: He is alert and oriented to person, place, and time. Mental status is at baseline.     Motor: No weakness.     Gait: Gait normal.  Psychiatric:        Mood and Affect: Mood normal.        Behavior: Behavior normal.        Thought Content: Thought content normal.        Judgment: Judgment normal.       Results for orders  placed or performed in visit on 05/19/20  CBC w/Diff/Platelet  Result Value Ref Range   WBC 4.8 3.4 - 10.8 x10E3/uL   RBC 5.13 4.14 - 5.80 x10E6/uL   Hemoglobin 14.9 13.0 - 17.7 g/dL   Hematocrit 45.7 37.5 - 51.0 %   MCV 89 79 - 97 fL   MCH 29.0 26.6 - 33.0 pg   MCHC 32.6 31.5 - 35.7 g/dL   RDW 11.4 (L) 11.6 - 15.4 %   Platelets 127 (L) 150 - 450 x10E3/uL   Neutrophils 42 Not Estab. %   Lymphs 39 Not Estab. %   Monocytes 8 Not Estab. %   Eos 10 Not Estab. %   Basos 1 Not Estab. %   Neutrophils Absolute 2.0 1.4 - 7.0 x10E3/uL   Lymphocytes Absolute 1.9 0.7 - 3.1 x10E3/uL   Monocytes Absolute 0.4 0.1 - 0.9 x10E3/uL   EOS (ABSOLUTE) 0.5 (H) 0.0 - 0.4 x10E3/uL   Basophils Absolute 0.0 0.0 - 0.2 x10E3/uL   Immature Granulocytes 0 Not Estab. %   Immature Grans (Abs) 0.0 0.0 - 0.1 x10E3/uL  Comprehensive Metabolic Panel (CMET)  Result Value Ref Range   Glucose 171 (H) 65 - 99 mg/dL   BUN 10 6 - 24 mg/dL   Creatinine, Ser 1.01 0.76 - 1.27 mg/dL   GFR calc non Af Amer 90 >59 mL/min/1.73   GFR calc Af Amer 104 >59 mL/min/1.73   BUN/Creatinine Ratio 10 9 - 20   Sodium 136 134 - 144 mmol/L   Potassium 4.2 3.5 - 5.2 mmol/L   Chloride 102 96 - 106 mmol/L   CO2 21 20 - 29 mmol/L   Calcium 8.8 8.7 - 10.2 mg/dL   Total Protein 6.8 6.0 - 8.5 g/dL   Albumin 4.3 4.0 - 5.0 g/dL   Globulin, Total 2.5 1.5 - 4.5 g/dL   Albumin/Globulin Ratio 1.7 1.2 -  2.2   Bilirubin Total 0.4 0.0 - 1.2 mg/dL   Alkaline Phosphatase 84 48 - 121 IU/L   AST 14 0 - 40 IU/L   ALT 23 0 - 44 IU/L  Lipid Panel With LDL/HDL Ratio  Result Value Ref Range   Cholesterol, Total 132 100 - 199 mg/dL   Triglycerides 78 0 - 149 mg/dL   HDL 34 (L) >76 mg/dL   VLDL Cholesterol Cal 16 5 - 40 mg/dL   LDL Chol Calc (NIH) 82 0 - 99 mg/dL   LDL/HDL Ratio 2.4 0.0 - 3.6 ratio  HgB A1c  Result Value Ref Range   Hgb A1c MFr Bld 6.3 (H) 4.8 - 5.6 %   Est. average glucose Bld gHb Est-mCnc 134 mg/dL    Assessment & Plan     1.  Benign essential HTN Has not been taking BP medication regularly. Discussed importance of making sure to take daily. Discussed risk of elevated blood pressures. Patient agreeable and will return in 4 weeks after taking BP regularly to see if BP has improved. Will check labs as below and f/u pending results. - CBC w/Diff/Platelet - Comprehensive Metabolic Panel (CMET) - Lipid Panel With LDL/HDL Ratio - HgB A1c - benazepril (LOTENSIN) 20 MG tablet; Take 1 tablet (20 mg total) by mouth daily.  Dispense: 90 tablet; Refill: 3 - chlorthalidone (HYGROTON) 25 MG tablet; Take 1 tablet (25 mg total) by mouth daily.  Dispense: 90 tablet; Refill: 3  2. Diabetes mellitus without complication (HCC) Stable. Diagnosis pulled for medication refill. Continue current medical treatment plan. Will check labs as below and f/u pending results. - CBC w/Diff/Platelet - Comprehensive Metabolic Panel (CMET) - Lipid Panel With LDL/HDL Ratio - HgB A1c - glipiZIDE (GLUCOTROL XL) 10 MG 24 hr tablet; TAKE 1 TAB 30 MINS BEFORE THE BIGGEST MEAL OF THE DAY  Dispense: 90 tablet; Refill: 3 - pioglitazone (ACTOS) 15 MG tablet; Take 1 tablet (15 mg total) by mouth daily.  Dispense: 90 tablet; Refill: 3  3. Class 2 severe obesity due to excess calories with serious comorbidity and body mass index (BMI) of 36.0 to 36.9 in adult [facility]) Counseled patient on healthy lifestyle modifications including dieting and exercise.  Has been doing well. Steady weight loss.  - CBC w/Diff/Platelet - Comprehensive Metabolic Panel (CMET) - Lipid Panel With LDL/HDL Ratio - HgB A1c  4. Hypertriglyceridemia Diet controlled. Declines wanting to start a statin to lower cardiovascular risk that is associated with high BP and T2DM. Will check labs as below and f/u pending results. - CBC w/Diff/Platelet - Comprehensive Metabolic Panel (CMET) - Lipid Panel With LDL/HDL Ratio - HgB A1c  5. Vasculogenic erectile dysfunction, unspecified vasculogenic  erectile dysfunction type Noted today by patient. Advised that his high, uncontrolled BP contributes. Will give trial of Viagra as below. Adverse reactions and side effects verbally discussed. Good Rx coupon provided.  - sildenafil (VIAGRA) 100 MG tablet; Take 1 tablet (100 mg total) by mouth daily as needed for erectile dysfunction.  Dispense: 30 tablet; Refill: 0  Return in about 1 year (around 05/19/2021).      Delmer Islam, PA-C, have reviewed all documentation for this visit. The documentation on 05/23/20 for the exam, diagnosis, procedures, and orders are all accurate and complete.   Reine Just  [facility] [phone] (phone) [phone] (fax)  [facility]

## 2020-05-19 NOTE — Patient Instructions (Signed)

## 2020-05-20 LAB — CBC WITH DIFFERENTIAL/PLATELET
Basophils Absolute: 0 10*3/uL (ref 0.0–0.2)
Basos: 1 %
EOS (ABSOLUTE): 0.5 10*3/uL — ABNORMAL HIGH (ref 0.0–0.4)
Eos: 10 %
Hematocrit: 45.7 % (ref 37.5–51.0)
Hemoglobin: 14.9 g/dL (ref 13.0–17.7)
Immature Grans (Abs): 0 10*3/uL (ref 0.0–0.1)
Immature Granulocytes: 0 %
Lymphocytes Absolute: 1.9 10*3/uL (ref 0.7–3.1)
Lymphs: 39 %
MCH: 29 pg (ref 26.6–33.0)
MCHC: 32.6 g/dL (ref 31.5–35.7)
MCV: 89 fL (ref 79–97)
Monocytes Absolute: 0.4 10*3/uL (ref 0.1–0.9)
Monocytes: 8 %
Neutrophils Absolute: 2 10*3/uL (ref 1.4–7.0)
Neutrophils: 42 %
Platelets: 127 10*3/uL — ABNORMAL LOW (ref 150–450)
RBC: 5.13 x10E6/uL (ref 4.14–5.80)
RDW: 11.4 % — ABNORMAL LOW (ref 11.6–15.4)
WBC: 4.8 10*3/uL (ref 3.4–10.8)

## 2020-05-20 LAB — COMPREHENSIVE METABOLIC PANEL
ALT: 23 IU/L (ref 0–44)
AST: 14 IU/L (ref 0–40)
Albumin/Globulin Ratio: 1.7 (ref 1.2–2.2)
Albumin: 4.3 g/dL (ref 4.0–5.0)
Alkaline Phosphatase: 84 IU/L (ref 48–121)
BUN/Creatinine Ratio: 10 (ref 9–20)
BUN: 10 mg/dL (ref 6–24)
Bilirubin Total: 0.4 mg/dL (ref 0.0–1.2)
CO2: 21 mmol/L (ref 20–29)
Calcium: 8.8 mg/dL (ref 8.7–10.2)
Chloride: 102 mmol/L (ref 96–106)
Creatinine, Ser: 1.01 mg/dL (ref 0.76–1.27)
GFR calc Af Amer: 104 mL/min/{1.73_m2} (ref >59)
GFR calc non Af Amer: 90 mL/min/{1.73_m2} (ref >59)
Globulin, Total: 2.5 g/dL (ref 1.5–4.5)
Glucose: 171 mg/dL — ABNORMAL HIGH (ref 65–99)
Potassium: 4.2 mmol/L (ref 3.5–5.2)
Sodium: 136 mmol/L (ref 134–144)
Total Protein: 6.8 g/dL (ref 6.0–8.5)

## 2020-05-20 LAB — HEMOGLOBIN A1C
Est. average glucose Bld gHb Est-mCnc: 134 mg/dL
Hgb A1c MFr Bld: 6.3 % — ABNORMAL HIGH (ref 4.8–5.6)

## 2020-05-20 LAB — LIPID PANEL WITH LDL/HDL RATIO
Cholesterol, Total: 132 mg/dL (ref 100–199)
HDL: 34 mg/dL — ABNORMAL LOW (ref >39)
LDL Chol Calc (NIH): 82 mg/dL (ref 0–99)
LDL/HDL Ratio: 2.4 ratio (ref 0.0–3.6)
Triglycerides: 78 mg/dL (ref 0–149)
VLDL Cholesterol Cal: 16 mg/dL (ref 5–40)

## 2020-05-22 ENCOUNTER — Telehealth: Payer: Self-pay

## 2020-05-22 NOTE — Telephone Encounter (Signed)
-----   Message from Margaretann Loveless, PA-C sent at 05/22/2020  1:18 PM EDT ----- Blood count is normal and stable. Kidney and liver function are normal and stable. Sodium, potassium and calcium are normal. Cholesterol is normal. A1c has improved from 7.8 to now 6.3! Great job. Keep up the good work.

## 2020-05-22 NOTE — Telephone Encounter (Signed)
Patient advised as directed below. 

## 2020-06-30 ENCOUNTER — Other Ambulatory Visit: Payer: Self-pay

## 2020-06-30 ENCOUNTER — Encounter: Payer: Self-pay | Admitting: Physician Assistant

## 2020-06-30 ENCOUNTER — Ambulatory Visit (INDEPENDENT_AMBULATORY_CARE_PROVIDER_SITE_OTHER): Payer: Self-pay | Admitting: Physician Assistant

## 2020-06-30 VITALS — BP 146/87 | HR 80 | Temp 97.8°F | Resp 16 | Ht 73.0 in | Wt 278.6 lb

## 2020-06-30 DIAGNOSIS — Z6836 Body mass index (BMI) 36.0-36.9, adult: Secondary | ICD-10-CM

## 2020-06-30 DIAGNOSIS — I10 Essential (primary) hypertension: Secondary | ICD-10-CM

## 2020-06-30 NOTE — Progress Notes (Signed)
Established patient visit   Patient: Melvin Mccormick   DOB: 04-16-1976   44 y.o. Male  MRN: [id number] Visit Date: 06/30/2020  I,Sulibeya S Dimas,acting as a scribe for [employer], PA-C.,have documented all relevant documentation on the behalf of Margaretann Loveless, PA-C,as directed by  Margaretann Loveless, PA-C while in the presence of Margaretann Loveless, PA-C.  Today's healthcare provider: Margaretann Loveless, PA-C   Chief Complaint  Patient presents with   Hypertension   Subjective    HPI  Hypertension, follow-up  BP Readings from Last 3 Encounters:  06/30/20 (!) 146/87  05/19/20 (!) 160/101  06/16/19 (!) 144/97   Wt Readings from Last 3 Encounters:  06/30/20 278 lb 9.6 oz (126.4 kg)  05/19/20 273 lb 3.2 oz (123.9 kg)  06/16/19 284 lb (128.8 kg)     He was last seen for hypertension 4 weeks ago.  BP at that visit was 160/101. Management since that visit includes Discussed importance of making sure to take BP medication daily. Discussed risk of elevated blood pressures. Patient agreeable and will return in 4 weeks after taking BP regularly to see if BP has improved.  He reports fair compliance with treatment. Patient reports he has not being taking medication daily. He is not having side effects.  He does tobacco.  Outside blood pressures are not being checked. Symptoms: No chest pain No chest pressure  No palpitations No syncope  No dyspnea No orthopnea  No paroxysmal nocturnal dyspnea No lower extremity edema   Pertinent labs: Lab Results  Component Value Date   CHOL 132 05/19/2020   HDL 34 (L) 05/19/2020   LDLCALC 82 05/19/2020   TRIG 78 05/19/2020   CHOLHDL 4.7 06/16/2019   Lab Results  Component Value Date   NA 136 05/19/2020   K 4.2 05/19/2020   CREATININE 1.01 05/19/2020   GFRNONAA 90 05/19/2020   GFRAA 104 05/19/2020   GLUCOSE 171 (H) 05/19/2020     The 10-year ASCVD risk score Denman George DC Jr., et al., 2013) is: 24.6%    ---------------------------------------------------------------------------------------------------  Patient Active Problem List   Diagnosis Date Noted   Class 2 severe obesity due to excess calories with serious comorbidity and body mass index (BMI) of 38.0 to 38.9 in adult [facility]) 05/19/2020   Headache 07/07/2015   Diabetes mellitus without complication (HCC) 07/07/2015   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   chlorthalidone (HYGROTON) 25 MG tablet Take 1 tablet (25 mg total) by mouth daily.   glipiZIDE (GLUCOTROL XL) 10 MG 24 hr tablet TAKE 1 TAB 30 MINS BEFORE THE BIGGEST MEAL OF THE DAY   pioglitazone (ACTOS) 15 MG tablet Take 1 tablet (15 mg total) by mouth daily.   sildenafil (VIAGRA) 100 MG tablet Take 1 tablet (100 mg total) by mouth daily as needed for erectile dysfunction.   No facility-administered medications prior to visit.    Review of Systems  Constitutional: Negative for appetite change and fatigue.  Eyes: Negative for visual disturbance.  Respiratory: Negative for chest tightness and shortness of breath.   Cardiovascular: Negative for chest pain and palpitations.  Neurological: Positive for headaches.    Last CBC Lab Results  Component Value Date   WBC 4.8 05/19/2020   HGB 14.9 05/19/2020   HCT 45.7 05/19/2020   MCV 89 05/19/2020   MCH 29.0 05/19/2020   RDW 11.4 (L) 05/19/2020   PLT 127 (L) 05/19/2020   Last metabolic panel Lab  Results  Component Value Date   GLUCOSE 171 (H) 05/19/2020   NA 136 05/19/2020   K 4.2 05/19/2020   CL 102 05/19/2020   CO2 21 05/19/2020   BUN 10 05/19/2020   CREATININE 1.01 05/19/2020   GFRNONAA 90 05/19/2020   GFRAA 104 05/19/2020   CALCIUM 8.8 05/19/2020   PROT 6.8 05/19/2020   ALBUMIN 4.3 05/19/2020   LABGLOB 2.5 05/19/2020   AGRATIO 1.7 05/19/2020   BILITOT 0.4 05/19/2020   ALKPHOS 84 05/19/2020   AST 14 05/19/2020   ALT 23 05/19/2020      Objective    BP (!) 146/87 (BP Location: Left Arm, Patient Position: Sitting, Cuff Size: Large)    Pulse 80    Temp 97.8 F (36.6 C) (Temporal)    Resp 16    Ht 6\' 1"  (1.854 m)    Wt 278 lb 9.6 oz (126.4 kg)    BMI 36.76 kg/m  BP Readings from Last 3 Encounters:  06/30/20 (!) 146/87  05/19/20 (!) 160/101  06/16/19 (!) 144/97   Wt Readings from Last 3 Encounters:  06/30/20 278 lb 9.6 oz (126.4 kg)  05/19/20 273 lb 3.2 oz (123.9 kg)  06/16/19 284 lb (128.8 kg)      Physical Exam Vitals reviewed.  Constitutional:      General: He is not in acute distress.    Appearance: Normal appearance. He is well-developed. He is obese. He is not ill-appearing or diaphoretic.  HENT:     Head: Normocephalic and atraumatic.     Right Ear: Tympanic membrane, ear canal and external ear normal.     Left Ear: Tympanic membrane, ear canal and external ear normal.  Eyes:     General: No scleral icterus.    Extraocular Movements: Extraocular movements intact.     Conjunctiva/sclera: Conjunctivae normal.     Pupils: Pupils are equal, round, and reactive to light.  Neck:     Thyroid: No thyromegaly.     Vascular: No carotid bruit or JVD.     Trachea: No tracheal deviation.  Cardiovascular:     Rate and Rhythm: Normal rate and regular rhythm.     Pulses: Normal pulses.     Heart sounds: Normal heart sounds. No murmur heard.  No friction rub. No gallop.   Pulmonary:     Effort: Pulmonary effort is normal. No respiratory distress.     Breath  sounds: Normal breath sounds. No wheezing or rales.  Musculoskeletal:     Cervical back: Normal range of motion and neck supple.     Right lower leg: No edema.     Left lower leg: No edema.  Lymphadenopathy:     Cervical: No cervical adenopathy.  Skin:    Capillary Refill: Capillary refill takes less than 2 seconds.  Neurological:     General: No focal deficit present.     Mental Status: He is alert and oriented to person, place, and time. Mental status  is at baseline.     Motor: No weakness.     Gait: Gait normal.  Psychiatric:        Mood and Affect: Mood normal.        Behavior: Behavior normal.        Thought Content: Thought content normal.        Judgment: Judgment normal.     No results found for any visits on 06/30/20.  Assessment & Plan     Problem List Items Addressed This Visit      Cardiovascular and Mediastinum   Benign essential HTN - Primary    Lower today, but patient has not taken medication since Sunday. Advised and encouraged to take medication daily Continue current medications daily F/u in 4 weeks        Other Visit Diagnoses    Class 2 severe obesity due to excess calories with serious comorbidity and body mass index (BMI) of 36.0 to 36.9 in adult [facility])           Return in about 4 weeks (around 07/28/2020) for chronic disease f/u.      Delmer Islam, PA-C, have reviewed all documentation for this visit. The documentation on 07/04/20 for the exam, diagnosis, procedures, and orders are all accurate and complete.   Reine Just  [facility] [phone] (phone) [phone] (fax)  [facility]

## 2020-06-30 NOTE — Assessment & Plan Note (Addendum)
Lower today, but patient has not taken medication since Sunday. Advised and encouraged to take medication daily Continue current medications daily F/u in 4 weeks

## 2020-06-30 NOTE — Patient Instructions (Addendum)

## 2020-07-04 ENCOUNTER — Encounter: Payer: Self-pay | Admitting: Physician Assistant

## 2020-07-24 ENCOUNTER — Encounter: Payer: Self-pay | Admitting: Physician Assistant

## 2020-07-24 ENCOUNTER — Ambulatory Visit (INDEPENDENT_AMBULATORY_CARE_PROVIDER_SITE_OTHER): Payer: Self-pay | Admitting: Physician Assistant

## 2020-07-24 ENCOUNTER — Other Ambulatory Visit: Payer: Self-pay

## 2020-07-24 VITALS — BP 154/98 | HR 78 | Temp 98.1°F | Resp 16 | Wt 280.2 lb

## 2020-07-24 DIAGNOSIS — M791 Myalgia, unspecified site: Secondary | ICD-10-CM

## 2020-07-24 DIAGNOSIS — I10 Essential (primary) hypertension: Secondary | ICD-10-CM

## 2020-07-24 MED ORDER — IBUPROFEN 800 MG PO TABS: 800.0000 mg | ORAL_TABLET | Freq: Three times a day (TID) | ORAL | 0 refills | Status: DC | PRN

## 2020-07-24 NOTE — Progress Notes (Signed)
Established patient visit   Patient: Melvin Mccormick   DOB: December 03, 1976   44 y.o. Male  MRN: [id number] Visit Date: 07/24/2020  Today's healthcare provider: Margaretann Loveless, PA-C   Chief Complaint  Patient presents with  . Hypertension   Subjective    HPI  Hypertension, follow-up  BP Readings from Last 3 Encounters:  07/24/20 (!) 154/98  06/30/20 (!) 146/87  05/19/20 (!) 160/101   Wt Readings from Last 3 Encounters:  07/24/20 280 lb 3.2 oz (127.1 kg)  06/30/20 278 lb 9.6 oz (126.4 kg)  05/19/20 273 lb 3.2 oz (123.9 kg)     He was last seen for hypertension 3 weeks ago.  BP at that visit was 146/87. Management since that visit includes encouraged patient to be compliant with taking medication daily.  He reports poor compliance with treatment. Patient states that he forgot medication at home and was gone from home for about 4-5days, patient states while he was away from home he took a baby Aspirin (81mg ) daily.  He is not having side effects.   He is following a Regular diet. He is not exercising. He does smoke.  Use of agents associated with hypertension: none.   Outside blood pressures are not being checked. Symptoms: No chest pain No chest pressure  No palpitations No syncope  No dyspnea No orthopnea  No paroxysmal nocturnal dyspnea No lower extremity edema   Pertinent labs: Lab Results  Component Value Date   CHOL 132 05/19/2020   HDL 34 (L) 05/19/2020   LDLCALC 82 05/19/2020   TRIG 78 05/19/2020   CHOLHDL 4.7 06/16/2019   Lab Results  Component Value Date   NA 136 05/19/2020   K 4.2 05/19/2020   CREATININE 1.01 05/19/2020   GFRNONAA 90 05/19/2020   GFRAA 104 05/19/2020   GLUCOSE 171 (H) 05/19/2020     The 10-year ASCVD risk score 07/19/2020 DC Jr., et al., 2013) is: 26.8%   ---------------------------------------------------------------------------------------------------  Patient Active Problem List   Diagnosis Date Noted  . Class 2 severe  obesity due to excess calories with serious comorbidity and body mass index (BMI) of 38.0 to 38.9 in adult (HCC) 05/19/2020  . Headache 07/07/2015  . Diabetes mellitus without complication (HCC) 07/07/2015  . 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 Benign essential HTN 05/06/2008   Past Medical History:  Diagnosis Date  . Diabetes mellitus without complication (HCC)   . Hypertension    Social History   Tobacco Use  . Smoking status: Light Tobacco Smoker  . Smokeless tobacco: Current User    Types: Snuff  . Tobacco comment: occasionally  Substance Use Topics  . Alcohol use: Yes    Alcohol/week: 0.0 standard drinks  . Drug use: Not on file   No Known Allergies     Medications: Outpatient Medications Prior to Visit  Medication Sig  . benazepril (LOTENSIN) 20 MG tablet Take 1 tablet (20 mg total) by mouth daily. (Patient not taking: Reported on 07/24/2020)  . chlorthalidone (HYGROTON) 25 MG tablet Take 1 tablet (25 mg total) by mouth daily. (Patient not taking: Reported on 07/24/2020)  . glipiZIDE (GLUCOTROL XL) 10 MG 24 hr tablet TAKE 1 TAB 30 MINS BEFORE THE BIGGEST MEAL OF THE DAY (Patient not taking: Reported on 07/24/2020)  . pioglitazone (ACTOS) 15 MG tablet Take 1 tablet (15 mg total) by mouth daily. (Patient not taking: Reported on 07/24/2020)  . sildenafil (VIAGRA) 100 MG tablet Take 1 tablet (100  mg total) by mouth daily as needed for erectile dysfunction. (Patient not taking: Reported on 07/24/2020)   No facility-administered medications prior to visit.    Review of Systems  Constitutional: Negative.   HENT: Negative.   Respiratory: Negative.   Cardiovascular: Negative.   Gastrointestinal: Negative.   Musculoskeletal: Negative.     Last metabolic panel Lab Results  Component Value Date   GLUCOSE 171 (H) 05/19/2020   NA 136 05/19/2020   K 4.2 05/19/2020   CL 102 05/19/2020   CO2 21 05/19/2020   BUN 10 05/19/2020    CREATININE 1.01 05/19/2020   GFRNONAA 90 05/19/2020   GFRAA 104 05/19/2020   CALCIUM 8.8 05/19/2020   PROT 6.8 05/19/2020   ALBUMIN 4.3 05/19/2020   LABGLOB 2.5 05/19/2020   AGRATIO 1.7 05/19/2020   BILITOT 0.4 05/19/2020   ALKPHOS 84 05/19/2020   AST 14 05/19/2020   ALT 23 05/19/2020   Last lipids Lab Results  Component Value Date   CHOL 132 05/19/2020   HDL 34 (L) 05/19/2020   LDLCALC 82 05/19/2020   TRIG 78 05/19/2020   CHOLHDL 4.7 06/16/2019   Last thyroid functions Lab Results  Component Value Date   TSH 1.550 06/16/2019      Objective    BP (!) 154/98   Pulse 78   Temp 98.1 F (36.7 C) (Oral)   Resp 16   Wt 280 lb 3.2 oz (127.1 kg)   SpO2 96%   BMI 36.97 kg/m    Physical Exam Vitals reviewed. Exam conducted with a chaperone present.  Constitutional:      Appearance: Normal appearance. He is obese.  HENT:     Head: Normocephalic.  Cardiovascular:     Rate and Rhythm: Normal rate and regular rhythm.     Heart sounds: Normal heart sounds. No murmur heard.   Pulmonary:     Effort: Pulmonary effort is normal.     Breath sounds: Normal breath sounds.  Skin:    General: Skin is warm and dry.  Neurological:     General: No focal deficit present.     Mental Status: He is alert and oriented to person, place, and time. Mental status is at baseline.  Psychiatric:        Mood and Affect: Mood normal.        Behavior: Behavior normal.      No results found for any visits on 07/24/20.  Assessment & Plan     1. Essential hypertension Elevated. Patient has not been compliant with medications. Forgot medications when he was out of town and has not taken in over a week. Will try to be more diligent with medications and f/u in 4-6 weeks.   2. Myalgia Stable. Diagnosis pulled for medication refill. Continue current medical treatment plan. - ibuprofen (ADVIL) 800 MG tablet; Take 1 tablet (800 mg total) by mouth every 8 (eight) hours as needed.  Dispense: 90  tablet; Refill: 0   No follow-ups on file.      Delmer Islam, PA-C, have reviewed all documentation for this visit. The documentation on 08/06/20 for the exam, diagnosis, procedures, and orders are all accurate and complete.   Reine Just  [facility] [phone] (phone) [phone] (fax)  [facility]

## 2020-08-06 ENCOUNTER — Encounter: Payer: Self-pay | Admitting: Physician Assistant

## 2020-08-31 ENCOUNTER — Ambulatory Visit: Payer: Self-pay | Admitting: Physician Assistant

## 2021-01-10 ENCOUNTER — Other Ambulatory Visit: Payer: Self-pay

## 2021-01-10 DIAGNOSIS — I10 Essential (primary) hypertension: Secondary | ICD-10-CM

## 2021-01-10 MED ORDER — CHLORTHALIDONE 25 MG PO TABS: 25.0000 mg | ORAL_TABLET | Freq: Every day | ORAL | 3 refills | Status: DC

## 2021-01-10 NOTE — Telephone Encounter (Signed)
Please review. Thanks!  

## 2021-01-10 NOTE — Telephone Encounter (Signed)
Patient requesting refills on Chlorthalidone to be sent to [facility] [address]

## 2021-05-22 ENCOUNTER — Other Ambulatory Visit: Payer: Self-pay | Admitting: Physician Assistant

## 2021-05-22 DIAGNOSIS — I10 Essential (primary) hypertension: Secondary | ICD-10-CM

## 2021-05-22 DIAGNOSIS — E119 Type 2 diabetes mellitus without complications: Secondary | ICD-10-CM

## 2021-06-07 ENCOUNTER — Other Ambulatory Visit: Payer: Self-pay

## 2021-06-07 DIAGNOSIS — E119 Type 2 diabetes mellitus without complications: Secondary | ICD-10-CM

## 2021-06-07 MED ORDER — GLIPIZIDE ER 10 MG PO TB24: 10.0000 mg | ORAL_TABLET | Freq: Every day | ORAL | 0 refills | Status: DC

## 2021-09-06 ENCOUNTER — Other Ambulatory Visit: Payer: Self-pay | Admitting: Family Medicine

## 2021-09-06 DIAGNOSIS — I10 Essential (primary) hypertension: Secondary | ICD-10-CM

## 2021-09-06 NOTE — Telephone Encounter (Signed)
Requested medication (s) are due for refill today: Yes  Requested medication (s) are on the active medication list: Yes  Last refill:  06/07/21  Future visit scheduled: No  Notes to clinic:  Attempted to call and schedule appointment. Phone not in service.    Requested Prescriptions  Pending Prescriptions Disp Refills   benazepril (LOTENSIN) 20 MG tablet [Pharmacy Med Name: BENAZEPRIL HCL 20 MG TABLET] 90 tablet 0    Sig: TAKE 1 TABLET BY MOUTH EVERY DAY     Cardiovascular:  ACE Inhibitors Failed - 09/06/2021 10:34 AM      Failed - Cr in normal range and within 180 days    Creat  Date Value Ref Range Status  09/04/2017 1.01 0.60 - 1.35 mg/dL Final   Creatinine, Ser  Date Value Ref Range Status  05/19/2020 1.01 0.76 - 1.27 mg/dL Final          Failed - K in normal range and within 180 days    Potassium  Date Value Ref Range Status  05/19/2020 4.2 3.5 - 5.2 mmol/L Final          Failed - Last BP in normal range    BP Readings from Last 1 Encounters:  07/24/20 (!) 154/98          Failed - Valid encounter within last 6 months    Recent Outpatient Visits           1 year ago Essential hypertension   [facility] [place], [place], [place]   1 year ago Benign essential HTN   [facility] [place], [place], [place]   1 year ago Benign essential HTN   [facility] [place], [place], [place]   2 years ago Benign essential HTN   [facility] [place], [place], [place]   2 years ago Viral syndrome   [facility] [place], [place]              YIFOYD - Patient is not pregnant

## 2021-09-11 ENCOUNTER — Other Ambulatory Visit: Payer: Self-pay | Admitting: Family Medicine

## 2021-09-11 DIAGNOSIS — E119 Type 2 diabetes mellitus without complications: Secondary | ICD-10-CM

## 2021-10-11 ENCOUNTER — Other Ambulatory Visit: Payer: Self-pay | Admitting: Family Medicine

## 2021-10-11 DIAGNOSIS — E119 Type 2 diabetes mellitus without complications: Secondary | ICD-10-CM

## 2021-10-11 NOTE — Telephone Encounter (Signed)
Requested medication (s) are due for refill today:   Yes  Requested medication (s) are on the active medication list:   Yes  Future visit scheduled:   No attempted to call pt to make an appt but his voice mailbox is full and not accepting messages at this time.  An attempt was done in Sept. To contact him also for an appt.   Last ordered: Glucotrol XL 09/11/2021 #30, 0 refills;   Same with the Actos.  Returned because unable to contact pt for appt and had courtesy refill in Sept.     Requested Prescriptions  Pending Prescriptions Disp Refills   glipiZIDE (GLUCOTROL XL) 10 MG 24 hr tablet [Pharmacy Med Name: GLIPIZIDE ER 10 MG TABLET] 30 tablet 0    Sig: TAKE 1 TABLET (10 MG TOTAL) BY MOUTH DAILY WITH BREAKFAST.     Endocrinology:  Diabetes - Sulfonylureas Failed - 10/11/2021  8:31 AM      Failed - HBA1C is between 0 and 7.9 and within 180 days    Hgb A1c MFr Bld  Date Value Ref Range Status  05/19/2020 6.3 (H) 4.8 - 5.6 % Final    Comment:             Prediabetes: 5.7 - 6.4          Diabetes: >6.4          Glycemic control for adults with diabetes: <7.0           Failed - Valid encounter within last 6 months    Recent Outpatient Visits           1 year ago Essential hypertension   [facility] [place], [place], [place]   1 year ago Benign essential HTN   [facility] [place], [place], [place]   1 year ago Benign essential HTN   [facility] [place], [place], [place]   2 years ago Benign essential HTN   [facility] [place], [place], [place]   2 years ago Viral syndrome   [facility] [place], [place], [place]               pioglitazone (ACTOS) 15 MG tablet [Pharmacy Med Name: PIOGLITAZONE HCL 15 MG TABLET] 30 tablet 0    Sig: TAKE 1 TABLET BY MOUTH EVERY DAY     Endocrinology:  Diabetes - Glitazones - pioglitazone Failed - 10/11/2021  8:31 AM      Failed - HBA1C is between 0 and 7.9 and within  180 days    Hgb A1c MFr Bld  Date Value Ref Range Status  05/19/2020 6.3 (H) 4.8 - 5.6 % Final    Comment:             Prediabetes: 5.7 - 6.4          Diabetes: >6.4          Glycemic control for adults with diabetes: <7.0           Failed - Valid encounter within last 6 months    Recent Outpatient Visits           1 year ago Essential hypertension   [facility] [place], [place], [place]   1 year ago Benign essential HTN   [facility] [place], [place], [place]   1 year ago Benign essential HTN   [facility] [place], [place], [place]   2 years ago Benign essential HTN   [facility] [place],  [place], [place]   2 years ago Viral syndrome   [facility] [place], [place]

## 2021-11-01 ENCOUNTER — Other Ambulatory Visit: Payer: Self-pay | Admitting: Family Medicine

## 2021-11-01 DIAGNOSIS — E119 Type 2 diabetes mellitus without complications: Secondary | ICD-10-CM

## 2021-11-01 NOTE — Telephone Encounter (Signed)
Requested medication (s) are due for refill today Yes  Requested medication (s) are on the active medication list Yes  Future visit scheduled  No  Note to clinic-Latest refill note states the patient needs to make appointment with a new provider at the office. There are no providers accepting new patients that are available for [facility] to schedule new patient appointment at this time. Routing to office for review   Requested Prescriptions  Pending Prescriptions Disp Refills   pioglitazone (ACTOS) 15 MG tablet [Pharmacy Med Name: PIOGLITAZONE HCL 15 MG TABLET] 90 tablet 1    Sig: TAKE 1 TABLET BY MOUTH EVERY DAY     Endocrinology:  Diabetes - Glitazones - pioglitazone Failed - 11/01/2021  9:27 AM      Failed - HBA1C is between 0 and 7.9 and within 180 days    Hgb A1c MFr Bld  Date Value Ref Range Status  05/19/2020 6.3 (H) 4.8 - 5.6 % Final    Comment:             Prediabetes: 5.7 - 6.4          Diabetes: >6.4          Glycemic control for adults with diabetes: <7.0           Failed - Valid encounter within last 6 months    Recent Outpatient Visits           1 year ago Essential hypertension   [facility] [place], [place], [place]   1 year ago Benign essential HTN   [facility] [place], [place], [place]   1 year ago Benign essential HTN   [facility] [place], [place], [place]   2 years ago Benign essential HTN   [facility] [place], [place], [place]   2 years ago Viral syndrome   [facility] [place], [place], [place]               glipiZIDE (GLUCOTROL XL) 10 MG 24 hr tablet [Pharmacy Med Name: GLIPIZIDE ER 10 MG TABLET] 90 tablet 1    Sig: TAKE 1 TABLET (10 MG TOTAL) BY MOUTH DAILY WITH BREAKFAST.     Endocrinology:  Diabetes - Sulfonylureas Failed - 11/01/2021  9:27 AM      Failed - HBA1C is between 0 and 7.9 and within 180 days    Hgb A1c MFr Bld  Date Value Ref Range Status  05/19/2020 6.3  (H) 4.8 - 5.6 % Final    Comment:             Prediabetes: 5.7 - 6.4          Diabetes: >6.4          Glycemic control for adults with diabetes: <7.0           Failed - Valid encounter within last 6 months    Recent Outpatient Visits           1 year ago Essential hypertension   [facility] [place], [place], [place]   1 year ago Benign essential HTN   [facility] [place], [place], [place]   1 year ago Benign essential HTN   [facility] [place], [place], [place]   2 years ago Benign essential HTN   [facility] [place], [place], [place]   2 years ago Viral syndrome   [facility] [place], [place], [place]

## 2021-11-02 NOTE — Telephone Encounter (Signed)
Call attempted, mailbox full. 

## 2021-11-14 NOTE — Progress Notes (Deleted)
Established patient visit   Patient: Melvin Mccormick   DOB: 11-24-1976   45 y.o. Male  MRN: [id number] Visit Date: 11/15/2021  Today's healthcare provider: Alfredia Ferguson, PA-C   No chief complaint on file.  Subjective    HPI  Diabetes Mellitus Type II, follow-up  Lab Results  Component Value Date   HGBA1C 6.3 (H) 05/19/2020   HGBA1C 7.8 (H) 06/16/2019   HGBA1C 8.8 09/04/2017   Last seen for diabetes 1 years ago.  Management since then includes continuing the same treatment. He reports {excellent/good/fair/poor:19665} compliance with treatment. He {is/is not:21021397} having side effects. {document side effects if present:1}  Home blood sugar records: {diabetes glucometry results:16657}  Episodes of hypoglycemia? {Yes/No:20286} {enter details if yes:1}   Current insulin regiment: {***Type 'None' if not taking insulin                                                otherwise enter complete                                                 details of insulin regiment:1} Most Recent Eye Exam: ***  --------------------------------------------------------------------------------------------------- Hypertension, follow-up  BP Readings from Last 3 Encounters:  07/24/20 (!) 154/98  06/30/20 (!) 146/87  05/19/20 (!) 160/101   Wt Readings from Last 3 Encounters:  07/24/20 280 lb 3.2 oz (127.1 kg)  06/30/20 278 lb 9.6 oz (126.4 kg)  05/19/20 273 lb 3.2 oz (123.9 kg)     He was last seen for hypertension 1 years ago.  BP at that visit was 154/98. Management since that visit includes try to be more diligent with medications and f/u in 4-6 weeks. He reports {excellent/good/fair/poor:19665} compliance with treatment. He {is/is not:9024} having side effects. {document side effects if present:1} He {is/is not:9024} exercising. He {is/is not:9024} adherent to low salt diet.   Outside blood pressures are {enter patient reported home BP, or 'not being checked':1}.  He  {does/does not:200015} smoke.  Use of agents associated with hypertension: {bp agents assoc with hypertension:511::"none"}.   ---------------------------------------------------------------------------- Follow up for Myalgias  The patient was last seen for this 1 years ago. Changes made at last visit include refilled IBU 800 mg.  He reports {excellent/good/fair/poor:19665} compliance with treatment. He feels that condition is {improved/worse/unchanged:3041574}. He {is/is not:21021397} having side effects. ***   {Link to patient history deactivated due to formatting error:1}  Medications: Outpatient Medications Prior to Visit  Medication Sig   benazepril (LOTENSIN) 20 MG tablet Take 1 tablet (20 mg total) by mouth daily. Please schedule office visit before any future refills.   chlorthalidone (HYGROTON) 25 MG tablet Take 1 tablet (25 mg total) by mouth daily.   glipiZIDE (GLUCOTROL XL) 10 MG 24 hr tablet TAKE 1 TABLET (10 MG TOTAL) BY MOUTH DAILY WITH BREAKFAST.   ibuprofen (ADVIL) 800 MG tablet Take 1 tablet (800 mg total) by mouth every 8 (eight) hours as needed.   pioglitazone (ACTOS) 15 MG tablet TAKE 1 TABLET BY MOUTH EVERY DAY   sildenafil (VIAGRA) 100 MG tablet Take 1 tablet (100 mg total) by mouth daily as needed for erectile dysfunction. (Patient not taking: Reported on 07/24/2020)   No facility-administered medications prior  to visit.    Review of Systems  {Labs  Heme  Chem  Endocrine  Serology  Results Review (optional):23779}   Objective    There were no vitals taken for this visit. {Show previous vital signs (optional):23777}  Physical Exam  ***  No results found for any visits on 11/15/21.  Assessment & Plan     ***  No follow-ups on file.      {provider attestation***:1}   Alfredia Ferguson, PA-C  [facility] [phone] (phone) [phone] (fax)  [facility]

## 2021-11-15 ENCOUNTER — Ambulatory Visit: Payer: Self-pay | Admitting: Physician Assistant

## 2021-11-16 ENCOUNTER — Encounter: Payer: Self-pay | Admitting: Physician Assistant

## 2021-11-16 ENCOUNTER — Other Ambulatory Visit: Payer: Self-pay

## 2021-11-16 ENCOUNTER — Ambulatory Visit (INDEPENDENT_AMBULATORY_CARE_PROVIDER_SITE_OTHER): Payer: Self-pay | Admitting: Physician Assistant

## 2021-11-16 VITALS — BP 182/102 | HR 86 | Ht 73.0 in | Wt 289.0 lb

## 2021-11-16 DIAGNOSIS — E119 Type 2 diabetes mellitus without complications: Secondary | ICD-10-CM

## 2021-11-16 DIAGNOSIS — Z1159 Encounter for screening for other viral diseases: Secondary | ICD-10-CM

## 2021-11-16 DIAGNOSIS — I10 Essential (primary) hypertension: Secondary | ICD-10-CM

## 2021-11-16 DIAGNOSIS — Z114 Encounter for screening for human immunodeficiency virus [HIV]: Secondary | ICD-10-CM

## 2021-11-16 DIAGNOSIS — M791 Myalgia, unspecified site: Secondary | ICD-10-CM

## 2021-11-16 DIAGNOSIS — Z1211 Encounter for screening for malignant neoplasm of colon: Secondary | ICD-10-CM

## 2021-11-16 DIAGNOSIS — R21 Rash and other nonspecific skin eruption: Secondary | ICD-10-CM

## 2021-11-16 MED ORDER — IBUPROFEN 800 MG PO TABS: 800.0000 mg | ORAL_TABLET | Freq: Three times a day (TID) | ORAL | 0 refills | Status: DC | PRN

## 2021-11-16 MED ORDER — CHLORTHALIDONE 25 MG PO TABS: 25.0000 mg | ORAL_TABLET | Freq: Every day | ORAL | 3 refills | Status: DC

## 2021-11-16 MED ORDER — BENAZEPRIL HCL 20 MG PO TABS
20.0000 mg | ORAL_TABLET | Freq: Every day | ORAL | 0 refills | Status: DC
Start: 2021-11-16 — End: 2022-02-13

## 2021-11-16 MED ORDER — KETOCONAZOLE 2 % EX CREA: 1.0000 "application " | TOPICAL_CREAM | Freq: Every day | CUTANEOUS | 0 refills | Status: DC

## 2021-11-16 MED ORDER — GLIPIZIDE ER 10 MG PO TB24: 10.0000 mg | ORAL_TABLET | Freq: Every day | ORAL | 0 refills | Status: DC

## 2021-11-16 NOTE — Assessment & Plan Note (Signed)
Wide differential, refer to dermatology for biopsy.

## 2021-11-16 NOTE — Assessment & Plan Note (Addendum)
Uncontrolled currently not medicated.  Spent some time explaining why having consistently elevated blood pressure is dangerous for risk of heart attack and stroke.  Patient reports not having side effects on previous medications, will reorder.  Ideally would like him to come back in a month for blood pressure check but patient reports not being able to.  We will schedule the first available appointment with the patient.  Advised him to try to check his blood pressure at home.

## 2021-11-16 NOTE — Progress Notes (Signed)
Established Patient Office Visit  Subjective:  Patient ID: Melvin Mccormick, male    DOB: 09-19-1976  Age: 45 y.o. MRN: [id number]  CC:  Chief Complaint  Patient presents with   Diabetes   Hypertension    HPI Melvin Mccormick is a 45 y/o male who presents today for follow-up of hypertension and diabetes, as well as a new rash across his body.  He states that he was treated in the past for high blood pressure diabetes but once he ran out of his medications and never started taking it again.  He knows he has high blood pressure and does not feel well in general and his fatigue.  Denies any headache, chest pain, shortness of breath.  He states he has a rash across his entire body, arms and legs, abdomen that presents as a small thickened skin lesion that he thinks he scratches in the night and sometimes they are crusted and bleeding.  Overall during the day not too itchy, not painful.  He has tried changing soaps/detergents with no success.  No history of eczema or psoriasis.  He denies being sexually active.  Denies ever seeing any lesions on his genitals.  He reports not currently taking any medications, but for his aches and pains as he is not leads an active life, he prefers taking ibuprofen 800 mg.  Wonders if this can be prescribed instead of over-the-counter.  Past Medical History:  Diagnosis Date   Diabetes mellitus without complication (HCC)    Hypertension     History reviewed. No pertinent surgical history.  History reviewed. No pertinent family history.  Social History   Socioeconomic History   Marital status: Single    Spouse name: Not on file   Number of children: Not on file   Years of education: Not on file   Highest education level: Not on file  Occupational History   Not on file  Tobacco Use   Smoking status: Every Day    Packs/day: 0.50    Years: 7.00    Pack years: 3.50    Types: Cigarettes, E-cigarettes   Smokeless tobacco: Current    Types: Snuff  Vaping  Use   Vaping Use: Never used  Substance and Sexual Activity   Alcohol use: Yes    Alcohol/week: 5.0 standard drinks    Types: 5 Standard drinks or equivalent per week   Drug use: Yes    Types: Marijuana    Comment: every other day   Sexual activity: Not Currently    Partners: Female  Other Topics Concern   Not on file  Social History Narrative   Not on file   Social Determinants of Health   Financial Resource Strain: Not on file  Food Insecurity: Not on file  Transportation Needs: Not on file  Physical Activity: Not on file  Stress: Not on file  Social Connections: Not on file  Intimate Partner Violence: Not on file    Outpatient Medications Prior to Visit  Medication Sig Dispense Refill   pioglitazone (ACTOS) 15 MG tablet TAKE 1 TABLET BY MOUTH EVERY DAY 30 tablet 0   zinc gluconate 50 MG tablet Take 50 mg by mouth daily.     benazepril (LOTENSIN) 20 MG tablet Take 1 tablet (20 mg total) by mouth daily. Please schedule office visit before any future refills. 90 tablet 0   chlorthalidone (HYGROTON) 25 MG tablet Take 1 tablet (25 mg total) by mouth daily. 90 tablet 3   glipiZIDE (GLUCOTROL XL) 10  MG 24 hr tablet TAKE 1 TABLET (10 MG TOTAL) BY MOUTH DAILY WITH BREAKFAST. 30 tablet 0   ibuprofen (ADVIL) 800 MG tablet Take 1 tablet (800 mg total) by mouth every 8 (eight) hours as needed. 90 tablet 0   sildenafil (VIAGRA) 100 MG tablet Take 1 tablet (100 mg total) by mouth daily as needed for erectile dysfunction. (Patient not taking: Reported on 07/24/2020) 30 tablet 0   No facility-administered medications prior to visit.    No Known Allergies  ROS Review of Systems  Constitutional:  Positive for fatigue. Negative for fever.  Respiratory:  Negative for cough and shortness of breath.   Cardiovascular:  Negative for chest pain.  Skin:  Positive for color change and rash.    Cheree Ditto derm o Objective:    Physical Exam Constitutional:      Appearance: He is obese.   Cardiovascular:     Rate and Rhythm: Normal rate and regular rhythm.     Pulses: Normal pulses.     Heart sounds: Normal heart sounds. No murmur heard. Pulmonary:     Effort: Pulmonary effort is normal.     Breath sounds: Normal breath sounds. No stridor. No wheezing, rhonchi or rales.  Skin:    General: Skin is warm and dry.     Findings: Rash present.     Comments: He has numerous small, less than 1 cm well-circumscribed raised scaling feeling lesions some with scabbing across his entire chest, back, arms, legs.  Neurological:     Mental Status: He is alert and oriented to person, place, and time.  Psychiatric:        Mood and Affect: Mood normal.        Behavior: Behavior normal.    BP (!) 182/102 (BP Location: Left Arm, Patient Position: Sitting, Cuff Size: Large)   Pulse 86   Ht 6\' 1"  (1.854 m)   Wt 289 lb (131.1 kg)   SpO2 100%   BMI 38.13 kg/m  Wt Readings from Last 3 Encounters:  11/16/21 289 lb (131.1 kg)  07/24/20 280 lb 3.2 oz (127.1 kg)  06/30/20 278 lb 9.6 oz (126.4 kg)     Health Maintenance Due  Topic Date Due   COVID-19 Vaccine (1) Never done   Pneumococcal Vaccine 48-82 Years old (1 - PCV) Never done   OPHTHALMOLOGY EXAM  Never done   Hepatitis C Screening  Never done   FOOT EXAM  06/15/2020   HEMOGLOBIN A1C  11/18/2020   COLONOSCOPY (Pts 45-13yrs Insurance coverage will need to be confirmed)  Never done   INFLUENZA VACCINE  Never done    There are no preventive care reminders to display for this patient.  Lab Results  Component Value Date   TSH 1.550 06/16/2019   Lab Results  Component Value Date   WBC 4.8 05/19/2020   HGB 14.9 05/19/2020   HCT 45.7 05/19/2020   MCV 89 05/19/2020   PLT 127 (L) 05/19/2020   Lab Results  Component Value Date   NA 136 05/19/2020   K 4.2 05/19/2020   CO2 21 05/19/2020   GLUCOSE 171 (H) 05/19/2020   BUN 10 05/19/2020   CREATININE 1.01 05/19/2020   BILITOT 0.4 05/19/2020   ALKPHOS 84 05/19/2020   AST  14 05/19/2020   ALT 23 05/19/2020   PROT 6.8 05/19/2020   ALBUMIN 4.3 05/19/2020   CALCIUM 8.8 05/19/2020   Lab Results  Component Value Date   CHOL 132 05/19/2020   Lab Results  Component Value Date   HDL 34 (L) 05/19/2020   Lab Results  Component Value Date   LDLCALC 82 05/19/2020   Lab Results  Component Value Date   TRIG 78 05/19/2020   Lab Results  Component Value Date   CHOLHDL 4.7 06/16/2019   Lab Results  Component Value Date   HGBA1C 6.3 (H) 05/19/2020      Assessment & Plan:   Problem List Items Addressed This Visit       Cardiovascular and Mediastinum   Benign essential HTN - Primary    Uncontrolled currently not medicated.  Spent some time explaining why having consistently elevated blood pressure is dangerous for risk of heart attack and stroke.  Patient reports not having side effects on previous medications, will reorder.  Ideally would like him to come back in a month for blood pressure check but patient reports not being able to.  We will schedule the first available appointment with the patient.  Advised him to try to check his blood pressure at home.      Relevant Medications   benazepril (LOTENSIN) 20 MG tablet   chlorthalidone (HYGROTON) 25 MG tablet   Other Relevant Orders   Comprehensive Metabolic Panel (CMET)   CBC   Lipid Profile     Endocrine   Diabetes mellitus without complication (HCC)    We will check A1c and CMP fasting.  Likely above 7% as unmedicated.  Reports previously having an adverse reaction to metformin.  Felt comfortable taking the glipizide and the Actos.  For now I will only reorder the glipizide and depending on his A1c likely choose another agent for diabetes control.  I will be in touch with the patient regarding this.      Relevant Medications   benazepril (LOTENSIN) 20 MG tablet   glipiZIDE (GLUCOTROL XL) 10 MG 24 hr tablet   Other Relevant Orders   HgB A1c     Musculoskeletal and Integument   Rash    Wide  differential, refer to dermatology for biopsy.      Relevant Medications   ketoconazole (NIZORAL) 2 % cream   Other Relevant Orders   RPR   Ambulatory referral to Dermatology   Other Visit Diagnoses     Myalgia       Relevant Medications   ibuprofen (ADVIL) 800 MG tablet   Encounter for hepatitis C screening test for low risk patient       Relevant Orders   Hepatitis C antibody   Screening for HIV (human immunodeficiency virus)       Relevant Orders   HIV antibody (with reflex)   Screening for malignant neoplasm of colon       Relevant Orders   Cologuard     ; Follow-up: Return in about 13 weeks (around 02/15/2022) for DMII, hypertension.   I, Alfredia Ferguson, PA-C have reviewed all documentation for this visit. The documentation on  11/16/2021 for the exam, diagnosis, procedures, and orders are all accurate and complete.  Alfredia Ferguson, PA-C [facility]

## 2021-11-16 NOTE — Assessment & Plan Note (Signed)
We will check A1c and CMP fasting.  Likely above 7% as unmedicated.  Reports previously having an adverse reaction to metformin.  Felt comfortable taking the glipizide and the Actos.  For now I will only reorder the glipizide and depending on his A1c likely choose another agent for diabetes control.  I will be in touch with the patient regarding this.

## 2021-11-17 LAB — LIPID PANEL
Chol/HDL Ratio: 5 ratio (ref 0.0–5.0)
Cholesterol, Total: 166 mg/dL (ref 100–199)
HDL: 33 mg/dL — ABNORMAL LOW (ref >39)
LDL Chol Calc (NIH): 110 mg/dL — ABNORMAL HIGH (ref 0–99)
Triglycerides: 129 mg/dL (ref 0–149)
VLDL Cholesterol Cal: 23 mg/dL (ref 5–40)

## 2021-11-17 LAB — COMPREHENSIVE METABOLIC PANEL
ALT: 28 IU/L (ref 0–44)
AST: 18 IU/L (ref 0–40)
Albumin/Globulin Ratio: 1.7 (ref 1.2–2.2)
Albumin: 4.6 g/dL (ref 4.0–5.0)
Alkaline Phosphatase: 92 IU/L (ref 44–121)
BUN/Creatinine Ratio: 15 (ref 9–20)
BUN: 14 mg/dL (ref 6–24)
Bilirubin Total: 0.4 mg/dL (ref 0.0–1.2)
CO2: 23 mmol/L (ref 20–29)
Calcium: 9.7 mg/dL (ref 8.7–10.2)
Chloride: 99 mmol/L (ref 96–106)
Creatinine, Ser: 0.95 mg/dL (ref 0.76–1.27)
Globulin, Total: 2.7 g/dL (ref 1.5–4.5)
Glucose: 235 mg/dL — ABNORMAL HIGH (ref 70–99)
Potassium: 4.3 mmol/L (ref 3.5–5.2)
Sodium: 136 mmol/L (ref 134–144)
Total Protein: 7.3 g/dL (ref 6.0–8.5)
eGFR: 101 mL/min/{1.73_m2} (ref >59)

## 2021-11-17 LAB — HIV ANTIBODY (ROUTINE TESTING W REFLEX): HIV Screen 4th Generation wRfx: NONREACTIVE

## 2021-11-17 LAB — CBC
Hematocrit: 47 % (ref 37.5–51.0)
Hemoglobin: 15.4 g/dL (ref 13.0–17.7)
MCH: 28.4 pg (ref 26.6–33.0)
MCHC: 32.8 g/dL (ref 31.5–35.7)
MCV: 87 fL (ref 79–97)
Platelets: 158 10*3/uL (ref 150–450)
RBC: 5.42 x10E6/uL (ref 4.14–5.80)
RDW: 10.9 % — ABNORMAL LOW (ref 11.6–15.4)
WBC: 5 10*3/uL (ref 3.4–10.8)

## 2021-11-17 LAB — RPR: RPR Ser Ql: NONREACTIVE

## 2021-11-17 LAB — HEMOGLOBIN A1C
Est. average glucose Bld gHb Est-mCnc: 186 mg/dL
Hgb A1c MFr Bld: 8.1 % — ABNORMAL HIGH (ref 4.8–5.6)

## 2021-11-17 LAB — HEPATITIS C ANTIBODY: Hep C Virus Ab: <0.1 s/co ratio (ref 0.0–0.9)

## 2021-12-15 ENCOUNTER — Other Ambulatory Visit: Payer: Self-pay | Admitting: Physician Assistant

## 2021-12-15 DIAGNOSIS — M791 Myalgia, unspecified site: Secondary | ICD-10-CM

## 2022-01-08 ENCOUNTER — Encounter: Payer: Self-pay | Admitting: Physician Assistant

## 2022-01-08 DIAGNOSIS — L439 Lichen planus, unspecified: Secondary | ICD-10-CM | POA: Insufficient documentation

## 2022-01-25 ENCOUNTER — Other Ambulatory Visit: Payer: Self-pay | Admitting: Physician Assistant

## 2022-01-25 DIAGNOSIS — E119 Type 2 diabetes mellitus without complications: Secondary | ICD-10-CM

## 2022-02-13 ENCOUNTER — Other Ambulatory Visit: Payer: Self-pay | Admitting: Physician Assistant

## 2022-02-13 DIAGNOSIS — I10 Essential (primary) hypertension: Secondary | ICD-10-CM

## 2022-04-24 ENCOUNTER — Other Ambulatory Visit: Payer: Self-pay | Admitting: Physician Assistant

## 2022-04-24 DIAGNOSIS — E119 Type 2 diabetes mellitus without complications: Secondary | ICD-10-CM

## 2022-07-25 ENCOUNTER — Ambulatory Visit (INDEPENDENT_AMBULATORY_CARE_PROVIDER_SITE_OTHER): Payer: Self-pay | Admitting: Family Medicine

## 2022-07-25 ENCOUNTER — Encounter: Payer: Self-pay | Admitting: Family Medicine

## 2022-07-25 VITALS — BP 187/133 | HR 95 | Resp 16 | Wt 286.0 lb

## 2022-07-25 DIAGNOSIS — Z6838 Body mass index (BMI) 38.0-38.9, adult: Secondary | ICD-10-CM

## 2022-07-25 DIAGNOSIS — I10 Essential (primary) hypertension: Secondary | ICD-10-CM

## 2022-07-25 DIAGNOSIS — M5441 Lumbago with sciatica, right side: Secondary | ICD-10-CM

## 2022-07-25 DIAGNOSIS — E66812 Obesity, class 2: Secondary | ICD-10-CM

## 2022-07-25 DIAGNOSIS — E119 Type 2 diabetes mellitus without complications: Secondary | ICD-10-CM

## 2022-07-25 MED ORDER — TELMISARTAN 20 MG PO TABS: 20.0000 mg | ORAL_TABLET | Freq: Every day | ORAL | 11 refills | Status: DC

## 2022-07-25 MED ORDER — CYCLOBENZAPRINE HCL 10 MG PO TABS: 10.0000 mg | ORAL_TABLET | Freq: Three times a day (TID) | ORAL | 0 refills | Status: DC | PRN

## 2022-07-25 MED ORDER — AMLODIPINE BESYLATE 5 MG PO TABS: 5.0000 mg | ORAL_TABLET | Freq: Every day | ORAL | 11 refills | Status: DC

## 2022-07-25 MED ORDER — PREDNISONE 20 MG PO TABS: 20.0000 mg | ORAL_TABLET | Freq: Every day | ORAL | 0 refills | Status: DC

## 2022-07-25 NOTE — Progress Notes (Signed)
Established patient visit  I,April Miller,acting as a scribe for Megan Mans, MD.,have documented all relevant documentation on the behalf of Megan Mans, MD,as directed by  Megan Mans, MD while in the presence of Megan Mans, MD.   Patient: Melvin Mccormick   DOB: Jan 25, 1976   46 y.o. Male  MRN: [id number] Visit Date: 07/25/2022  Today's healthcare provider: Megan Mans, MD   Chief Complaint  Patient presents with   Tailbone Pain   Subjective    HPI  Patient has had pain in his right buttocks for around 2 weeks.  Goes down his right leg.  Thinks it is sciatica.  He stepped out of a truck wrong.  No real trauma.  Patient is taking ibuprofen for treatment.  He has not been taking his medications.  He has no insurance. He states he cannot tolerate metformin because of GI side effects and headaches. Medications: Outpatient Medications Prior to Visit  Medication Sig   ibuprofen (ADVIL) 800 MG tablet Take 1 tablet (800 mg total) by mouth every 8 (eight) hours as needed.   chlorthalidone (HYGROTON) 25 MG tablet Take 1 tablet (25 mg total) by mouth daily. (Patient not taking: Reported on 07/25/2022)   glipiZIDE (GLUCOTROL XL) 10 MG 24 hr tablet TAKE 1 TABLET (10 MG TOTAL) BY MOUTH DAILY WITH BREAKFAST. (Patient not taking: Reported on 07/25/2022)   ketoconazole (NIZORAL) 2 % cream Apply 1 application topically daily. (Patient not taking: Reported on 07/25/2022)   pioglitazone (ACTOS) 15 MG tablet TAKE 1 TABLET BY MOUTH EVERY DAY (Patient not taking: Reported on 07/25/2022)   zinc gluconate 50 MG tablet Take 50 mg by mouth daily. (Patient not taking: Reported on 07/25/2022)   [DISCONTINUED] benazepril (LOTENSIN) 20 MG tablet TAKE 1 TABLET (20 MG TOTAL) BY MOUTH DAILY. PLEASE SCHEDULE OFFICE VISIT BEFORE ANY FUTURE REFILLS. (Patient not taking: Reported on 07/25/2022)   No facility-administered medications prior to visit.    Review of Systems  Last  hemoglobin A1c Lab Results  Component Value Date   HGBA1C 10.1 (H) 07/25/2022       Objective    BP (!) 187/133 (BP Location: Left Arm, Patient Position: Sitting, Cuff Size: Large)   Pulse 95   Resp 16   Wt 286 lb (129.7 kg)   SpO2 98%   BMI 37.73 kg/m  BP Readings from Last 3 Encounters:  07/25/22 (!) 187/133  11/16/21 (!) 182/102  07/24/20 (!) 154/98   Wt Readings from Last 3 Encounters:  07/25/22 286 lb (129.7 kg)  11/16/21 289 lb (131.1 kg)  07/24/20 280 lb 3.2 oz (127.1 kg)      Physical Exam Vitals reviewed.  Constitutional:      General: He is not in acute distress.    Appearance: He is well-developed.  HENT:     Head: Normocephalic and atraumatic.     Right Ear: Hearing normal.     Left Ear: Hearing normal.     Nose: Nose normal.  Eyes:     General: Lids are normal. No scleral icterus.       Right eye: No discharge.        Left eye: No discharge.     Conjunctiva/sclera: Conjunctivae normal.  Cardiovascular:     Rate and Rhythm: Normal rate and regular rhythm.     Heart sounds: Normal heart sounds.  Pulmonary:     Effort: Pulmonary effort is normal. No respiratory distress.  Skin:  General: Skin is warm and dry.     Findings: No lesion or rash.  Neurological:     General: No focal deficit present.     Mental Status: He is alert and oriented to person, place, and time.     Comments: Straight leg raise is positive on the right but strength is normal  Psychiatric:        Mood and Affect: Mood normal.        Speech: Speech normal.        Behavior: Behavior normal.        Thought Content: Thought content normal.        Judgment: Judgment normal.       No results found for any visits on 07/25/22.  Assessment & Plan     1. Diabetes mellitus without complication (HCC) Treated with glipizide and pioglitazone. - Hemoglobin A1c - Comprehensive Metabolic Panel (CMET)  2. Accelerated HTN 2 to 3 weeks - Hemoglobin A1c - Comprehensive Metabolic  Panel (CMET) - amLODipine (NORVASC) 5 MG tablet; Take 1 tablet (5 mg total) by mouth daily.  Dispense: 30 tablet; Refill: 11 - telmisartan (MICARDIS) 20 MG tablet; Take 1 tablet (20 mg total) by mouth daily.  Dispense: 30 tablet; Refill: 11  3. Acute right-sided low back pain with right-sided sciatica With prednisone and Flexeril. - predniSONE (DELTASONE) 20 MG tablet; Take 1 tablet (20 mg total) by mouth daily with breakfast.  Dispense: 7 tablet; Refill: 0 - cyclobenzaprine (FLEXERIL) 10 MG tablet; Take 1 tablet (10 mg total) by mouth every 8 (eight) hours as needed for muscle spasms.  Dispense: 30 tablet; Refill: 0   Return in about 2 weeks (around 08/08/2022).      I, Megan Mans, MD, have reviewed all documentation for this visit. The documentation on 07/28/22 for the exam, diagnosis, procedures, and orders are all accurate and complete.    Alyssah Algeo Wendelyn Breslow, MD  [facility] [phone] (phone) [phone] (fax)  [facility]

## 2022-07-26 LAB — COMPREHENSIVE METABOLIC PANEL
ALT: 34 IU/L (ref 0–44)
AST: 17 IU/L (ref 0–40)
Albumin/Globulin Ratio: 1.5 (ref 1.2–2.2)
Albumin: 4.6 g/dL (ref 4.1–5.1)
Alkaline Phosphatase: 121 IU/L (ref 44–121)
BUN/Creatinine Ratio: 15 (ref 9–20)
BUN: 13 mg/dL (ref 6–24)
Bilirubin Total: 0.4 mg/dL (ref 0.0–1.2)
CO2: 24 mmol/L (ref 20–29)
Calcium: 10 mg/dL (ref 8.7–10.2)
Chloride: 97 mmol/L (ref 96–106)
Creatinine, Ser: 0.88 mg/dL (ref 0.76–1.27)
Globulin, Total: 3.1 g/dL (ref 1.5–4.5)
Glucose: 288 mg/dL — ABNORMAL HIGH (ref 70–99)
Potassium: 4.7 mmol/L (ref 3.5–5.2)
Sodium: 137 mmol/L (ref 134–144)
Total Protein: 7.7 g/dL (ref 6.0–8.5)
eGFR: 107 mL/min/{1.73_m2} (ref >59)

## 2022-07-26 LAB — HEMOGLOBIN A1C
Est. average glucose Bld gHb Est-mCnc: 243 mg/dL
Hgb A1c MFr Bld: 10.1 % — ABNORMAL HIGH (ref 4.8–5.6)

## 2022-07-31 ENCOUNTER — Other Ambulatory Visit: Payer: Self-pay

## 2022-07-31 MED ORDER — GLIMEPIRIDE 2 MG PO TABS: 2.0000 mg | ORAL_TABLET | Freq: Every day | ORAL | 3 refills | Status: DC

## 2022-08-06 NOTE — Progress Notes (Signed)
Established patient visit  I,Melvin Mccormick,acting as a scribe for Melvin Mans, MD.,have documented all relevant documentation on the behalf of Melvin Mans, MD,as directed by  Melvin Mans, MD while in the presence of Melvin Mans, MD.   Patient: Melvin Mccormick   DOB: [birth year]   46 y.o. Male  MRN: [id number] Visit Date: 08/07/2022  Today's healthcare provider: Megan Mans, MD   Chief Complaint  Patient presents with   Follow-Up HTN and DM   Subjective    HPI  Patient comes in today for follow-up.  He thinks the glimepiride caused him to have a rash.  The rash is still mildly pruritic but doing better.  At night.  He is tolerating his telmisartan and his pioglitazone.  Diabetes Mellitus Type II, follow-up  Lab Results  Component Value Date   HGBA1C 10.1 (H) 07/25/2022   HGBA1C 8.1 (H) 11/16/2021   HGBA1C 6.3 (H) 05/19/2020   Last seen for diabetes 2 weeks ago.  Management since then includes; Treated with glipizide and pioglitazone. Reports that he took the Glimepiride on Sunday and developed a rash. Reports no sob.  --------------------------------------------------------------------------------------------------- Hypertension, follow-up  BP Readings from Last 3 Encounters:  08/07/22 (!) 177/121  07/25/22 (!) 187/133  11/16/21 (!) 182/102   Wt Readings from Last 3 Encounters:  08/07/22 291 lb 8 oz (132.2 kg)  07/25/22 286 lb (129.7 kg)  11/16/21 289 lb (131.1 kg)     He was last seen for hypertension 2 weeks ago.  Management since that visit includes; started amlodipine 5 mg and telmisartan 20 mg.    Outside blood pressures are not being checked.  --------------------------------------------------------------------------------------------------   Medications: Outpatient Medications Prior to Visit  Medication Sig   amLODipine (NORVASC) 5 MG tablet Take 1 tablet (5 mg total) by mouth daily. (Patient not taking: Reported  on 08/07/2022)   cyclobenzaprine (FLEXERIL) 10 MG tablet Take 1 tablet (10 mg total) by mouth every 8 (eight) hours as needed for muscle spasms. (Patient not taking: Reported on 08/07/2022)   glimepiride (AMARYL) 2 MG tablet Take 1 tablet (2 mg total) by mouth daily before breakfast. (Patient not taking: Reported on 08/07/2022)   ibuprofen (ADVIL) 800 MG tablet Take 1 tablet (800 mg total) by mouth every 8 (eight) hours as needed. (Patient not taking: Reported on 08/07/2022)   [DISCONTINUED] telmisartan (MICARDIS) 20 MG tablet Take 1 tablet (20 mg total) by mouth daily. (Patient not taking: Reported on 08/07/2022)   No facility-administered medications prior to visit.    Review of Systems  Last hemoglobin A1c Lab Results  Component Value Date   HGBA1C 10.1 (H) 07/25/2022       Objective    BP (!) 177/121 (BP Location: Right Arm, Patient Position: Sitting, Cuff Size: Large)   Pulse (!) 101   Resp 16   Wt 291 lb 8 oz (132.2 kg)   BMI 38.46 kg/m  BP Readings from Last 3 Encounters:  08/07/22 (!) 177/121  07/25/22 (!) 187/133  11/16/21 (!) 182/102   Wt Readings from Last 3 Encounters:  08/07/22 291 lb 8 oz (132.2 kg)  07/25/22 286 lb (129.7 kg)  11/16/21 289 lb (131.1 kg)      Physical Exam Vitals reviewed.  Constitutional:      General: He is not in acute distress.    Appearance: He is well-developed.  HENT:     Head: Normocephalic and atraumatic.     Right Ear: Hearing  normal.     Left Ear: Hearing normal.     Nose: Nose normal.  Eyes:     General: Lids are normal. No scleral icterus.       Right eye: No discharge.        Left eye: No discharge.     Conjunctiva/sclera: Conjunctivae normal.  Cardiovascular:     Rate and Rhythm: Normal rate and regular rhythm.     Pulses: Normal pulses.     Heart sounds: Normal heart sounds.  Pulmonary:     Effort: Pulmonary effort is normal. No respiratory distress.     Breath sounds: Normal breath sounds.  Abdominal:      Palpations: Abdomen is soft.  Skin:    General: Skin is warm and dry.     Findings: No lesion or rash.  Neurological:     General: No focal deficit present.     Mental Status: He is alert and oriented to person, place, and time.  Psychiatric:        Mood and Affect: Mood normal.        Speech: Speech normal.        Behavior: Behavior normal.        Thought Content: Thought content normal.        Judgment: Judgment normal.       No results found for any visits on 08/07/22.  Assessment & Plan     1. Diabetes mellitus without complication (HCC) Increase pioglitazone to 30 mg daily. Patient is intolerant of metformin and apparently allergic to glimepiride  2. Benign essential HTN Telmesartan. to 80 mg daily - pioglitazone (ACTOS) 30 MG tablet; Take 1 tablet (30 mg total) by mouth daily.  Dispense: 30 tablet; Refill: 5 - telmisartan (MICARDIS) 80 MG tablet; Take 1 tablet (80 mg total) by mouth daily.  Dispense: 30 tablet; Refill: 5  3. Class 2 severe obesity due to excess calories with serious comorbidity and body mass index (BMI) of 38.0 to 38.9 in adult [facility]) D and E  4. Rash Try Benadryl 2 at night/rash is much better  5. Acute right-sided low back pain with right-sided sciatica Resolved   No follow-ups on file.      I, Melvin Mans, MD, have reviewed all documentation for this visit. The documentation on 08/09/22 for the exam, diagnosis, procedures, and orders are all accurate and complete.    Melvin Susman Wendelyn Breslow, MD  [facility] [phone] (phone) [phone] (fax)  [facility]

## 2022-08-07 ENCOUNTER — Encounter: Payer: Self-pay | Admitting: Family Medicine

## 2022-08-07 ENCOUNTER — Ambulatory Visit (INDEPENDENT_AMBULATORY_CARE_PROVIDER_SITE_OTHER): Payer: Self-pay | Admitting: Family Medicine

## 2022-08-07 VITALS — BP 177/121 | HR 101 | Resp 16 | Wt 291.5 lb

## 2022-08-07 DIAGNOSIS — R21 Rash and other nonspecific skin eruption: Secondary | ICD-10-CM

## 2022-08-07 DIAGNOSIS — I10 Essential (primary) hypertension: Secondary | ICD-10-CM

## 2022-08-07 DIAGNOSIS — M5441 Lumbago with sciatica, right side: Secondary | ICD-10-CM

## 2022-08-07 DIAGNOSIS — Z6838 Body mass index (BMI) 38.0-38.9, adult: Secondary | ICD-10-CM

## 2022-08-07 DIAGNOSIS — E119 Type 2 diabetes mellitus without complications: Secondary | ICD-10-CM

## 2022-08-07 MED ORDER — TELMISARTAN 80 MG PO TABS: 80.0000 mg | ORAL_TABLET | Freq: Every day | ORAL | 5 refills | Status: DC

## 2022-08-07 MED ORDER — TELMISARTAN 40 MG PO TABS: 40.0000 mg | ORAL_TABLET | Freq: Two times a day (BID) | ORAL | 5 refills | Status: DC

## 2022-08-07 MED ORDER — PIOGLITAZONE HCL 30 MG PO TABS: 30.0000 mg | ORAL_TABLET | Freq: Every day | ORAL | 5 refills | Status: DC

## 2022-08-07 MED ORDER — TELMISARTAN 40 MG PO TABS: 40.0000 mg | ORAL_TABLET | Freq: Every day | ORAL | 5 refills | Status: DC

## 2022-08-07 NOTE — Patient Instructions (Signed)
START TAKING TELMISARTAN  TWICE DAILY. TAKE LEFTOVER PIOGLITAZONE 15 MG UNTIL GONE. THAN CHANGE TO PIOGLITAZONE 30 MG. TAKE BENADRYL TWO CAPSULES EVERY EVENING AS NEEDED FOR ITCHING.

## 2022-08-08 ENCOUNTER — Ambulatory Visit: Payer: Self-pay | Admitting: Family Medicine

## 2022-10-30 ENCOUNTER — Encounter: Payer: Self-pay | Admitting: Physician Assistant

## 2022-10-30 ENCOUNTER — Ambulatory Visit: Payer: PRIVATE HEALTH INSURANCE | Admitting: Physician Assistant

## 2022-10-30 VITALS — BP 180/114 | HR 86 | Wt 286.8 lb

## 2022-10-30 DIAGNOSIS — I152 Hypertension secondary to endocrine disorders: Secondary | ICD-10-CM

## 2022-10-30 DIAGNOSIS — E119 Type 2 diabetes mellitus without complications: Secondary | ICD-10-CM

## 2022-10-30 DIAGNOSIS — E1159 Type 2 diabetes mellitus with other circulatory complications: Secondary | ICD-10-CM | POA: Diagnosis not present

## 2022-10-30 LAB — POCT GLYCOSYLATED HEMOGLOBIN (HGB A1C): Hemoglobin A1C: 10.4 % — AB (ref 4.0–5.6)

## 2022-10-30 MED ORDER — EMPAGLIFLOZIN 25 MG PO TABS: 25.0000 mg | ORAL_TABLET | Freq: Every day | ORAL | 1 refills | Status: DC

## 2022-10-30 MED ORDER — AMLODIPINE BESYLATE 10 MG PO TABS: 10.0000 mg | ORAL_TABLET | Freq: Every day | ORAL | 1 refills | Status: DC

## 2022-10-30 NOTE — Progress Notes (Signed)
I,[name],acting as a Education administrator for [employer], PA-C.,have documented all relevant documentation on the behalf of Melvin Kirschner, PA-C,as directed by  Melvin Kirschner, PA-C while in the presence of Melvin Kirschner, PA-C.   Established patient visit   Patient: Melvin Mccormick   DOB: 1976/06/13   46 y.o. Male  MRN: [id number] Visit Date: 10/30/2022  Today's healthcare provider: [name], PA-C   Cc. DM II f/u, HTN f.u  Subjective    HPI   Diabetes Mellitus Type II, Follow-up  Lab Results  Component Value Date   HGBA1C 10.4 (A) 10/30/2022   HGBA1C 10.1 (H) 07/25/2022   HGBA1C 8.1 (H) 11/16/2021   Wt Readings from Last 3 Encounters:  10/30/22 286 lb 12.8 oz (130.1 kg)  08/07/22 291 lb 8 oz (132.2 kg)  07/25/22 286 lb (129.7 kg)   Last seen for diabetes 12 weeks ago.  Management since then includes Increase pioglitazone to 30 mg daily . He reports poor compliance with treatment. He stopped all of his medications. Reports one of his medications caused a rash, and as he was unsure which, stopped all medications.  Reports feeling fatigued and unmotivated, and having numbness in his feet that is unchanged, but otherwise feels fine. Denies chest pain, headaches, dizziness.  Has never regularly checked his blood sugar.   He is not having side effects.  Symptoms: No fatigue No foot ulcerations  No appetite changes No nausea  Yes paresthesia of the feet  No polydipsia  No polyuria No visual disturbances   No vomiting     Home blood sugar records:  not being checked  Episodes of hypoglycemia? No    Current insulin regiment: none Most Recent Eye Exam: UTD Current exercise: none Current diet habits: in general, a "healthy" diet  -- Pt admits to one meal a day, usually dinner. Usually a frozen meal-- pizza, chicken tenders, bologna sandwiches. Pertinent Labs: Lab Results  Component Value Date   CHOL 166 11/16/2021   HDL 33 (L) 11/16/2021   LDLCALC 110 (H)  11/16/2021   TRIG 129 11/16/2021   CHOLHDL 5.0 11/16/2021   Lab Results  Component Value Date   NA 137 07/25/2022   K 4.7 07/25/2022   CREATININE 0.88 07/25/2022   EGFR 107 07/25/2022     ---------------------------------------------------------------------------------------------------  Hypertension, follow-up  BP Readings from Last 3 Encounters:  10/30/22 (!) 180/114  08/07/22 (!) 177/121  07/25/22 (!) 187/133   Wt Readings from Last 3 Encounters:  10/30/22 286 lb 12.8 oz (130.1 kg)  08/07/22 291 lb 8 oz (132.2 kg)  07/25/22 286 lb (129.7 kg)     He was last seen for hypertension 12 weeks ago.  BP at that visit was 177/121. Management since that visit includes telmesartan. to 80 mg daily .  He reports poor compliance with treatment. See above, pt is currently not taking any medications. He is not having side effects.  He is following a Regular diet. See above for high salt diet. He is not exercising. He does smoke.  Use of agents associated with hypertension: none.   Symptoms: No chest pain No chest pressure  No palpitations No syncope  No dyspnea No orthopnea  No paroxysmal nocturnal dyspnea No lower extremity edema   Pertinent labs Lab Results  Component Value Date   CHOL 166 11/16/2021   HDL 33 (L) 11/16/2021   LDLCALC 110 (H) 11/16/2021   TRIG 129 11/16/2021   CHOLHDL 5.0 11/16/2021   Lab Results  Component Value Date   NA 137 07/25/2022   K 4.7 07/25/2022   CREATININE 0.88 07/25/2022   EGFR 107 07/25/2022   GLUCOSE 288 (H) 07/25/2022   TSH 1.550 06/16/2019     The 10-year ASCVD risk score (Arnett DK, et al., 2019) is: 39.9%  ---------------------------------------------------------------------------------------------------  Medications: Outpatient Medications Prior to Visit  Medication Sig   [DISCONTINUED] ibuprofen (ADVIL) 800 MG tablet Take 1 tablet (800 mg total) by mouth every 8 (eight) hours as needed.   [DISCONTINUED] amLODipine (NORVASC)  5 MG tablet Take 1 tablet (5 mg total) by mouth daily. (Patient not taking: Reported on 08/07/2022)   [DISCONTINUED] cyclobenzaprine (FLEXERIL) 10 MG tablet Take 1 tablet (10 mg total) by mouth every 8 (eight) hours as needed for muscle spasms. (Patient not taking: Reported on 08/07/2022)   [DISCONTINUED] glimepiride (AMARYL) 2 MG tablet Take 1 tablet (2 mg total) by mouth daily before breakfast. (Patient not taking: Reported on 08/07/2022)   [DISCONTINUED] pioglitazone (ACTOS) 30 MG tablet Take 1 tablet (30 mg total) by mouth daily. (Patient not taking: Reported on 10/30/2022)   [DISCONTINUED] telmisartan (MICARDIS) 80 MG tablet Take 1 tablet (80 mg total) by mouth daily. (Patient not taking: Reported on 10/30/2022)   No facility-administered medications prior to visit.    Review of Systems  Constitutional:  Positive for fatigue. Negative for fever.  Respiratory:  Negative for cough and shortness of breath.   Cardiovascular:  Negative for chest pain, palpitations and leg swelling.  Neurological:  Positive for numbness. Negative for dizziness and headaches.      Objective    Blood pressure (!) 180/114, pulse 86, weight 286 lb 12.8 oz (130.1 kg), SpO2 98 %.   Physical Exam Constitutional:      General: He is awake.     Appearance: He is well-developed.  HENT:     Head: Normocephalic.  Eyes:     Conjunctiva/sclera: Conjunctivae normal.  Cardiovascular:     Rate and Rhythm: Normal rate and regular rhythm.     Heart sounds: Normal heart sounds.  Pulmonary:     Effort: Pulmonary effort is normal.     Breath sounds: Normal breath sounds.  Skin:    General: Skin is warm.  Neurological:     Mental Status: He is alert and oriented to person, place, and time.  Psychiatric:        Attention and Perception: Attention normal.        Mood and Affect: Mood normal.        Speech: Speech normal.        Behavior: Behavior is cooperative.      Results for orders placed or performed in visit  on 10/30/22  POCT HgB A1C  Result Value Ref Range   Hemoglobin A1C 10.4 (A) 4.0 - 5.6 %   HbA1c POC (<> result, manual entry)     HbA1c, POC (prediabetic range)     HbA1c, POC (controlled diabetic range)      Assessment & Plan     Problem List Items Addressed This Visit       Cardiovascular and Mediastinum   Hypertension associated with diabetes ([place])    Pt was managed on amlodipine 5 mg and telmisartan 80 mg Again pt hesitant to restart all meds. Historically has been on and off amlodipine-- restarted at 10 mg. Advised pt he likely does need multiple agents to control blood pressure. Pt willing to start with one medication.   Spoke to pt at length regarding  consequences of elevated blood pressure.  Pt requested his 800 mg ibuprofen rx for aches/pains-- declined given uncontrolled BP and risk of bleed/stroke.      Relevant Medications   amLODipine (NORVASC) 10 MG tablet   empagliflozin (JARDIANCE) 25 MG TABS tablet   Other Relevant Orders   AMB Referral to [facility]     Endocrine   Diabetes mellitus without complication ([place]) - Primary    A1c today 10.3% I had a lengthy conversation with patient today regarding the consequences of untreated diabetes. He reports willingness to be compliant with treatment.  Historically:  On chart review it appears glimepiride is likely the medication that gave him a rash. H/o intolerance to metformin. Was previously on Actos, but pt is hesitant to restart and of his prior meds d/t history of rash.  Given samples of Jardiance in office and a 25 mg rx was sent in. Advised pt at this point, he likely needs injectable meds vs probably ultimately insulin if he cannot afford other alternatives. Pt refuses injectable meds including insulin at this time.        Relevant Medications   empagliflozin (JARDIANCE) 25 MG TABS tablet   Other Relevant Orders   POCT HgB A1C (Completed)   AMB Referral to [place]     Return in about 4 weeks (around 11/27/2022) for hypertension.      I, Melvin Kirschner, PA-C have reviewed all documentation for this visit. The documentation on  10/31/2022  for the exam, diagnosis, procedures, and orders are all accurate and complete.  Melvin Kirschner, PA-C [facility] [address] [place], [place], [address] Office: [phone] Fax: [place]

## 2022-10-30 NOTE — Assessment & Plan Note (Addendum)
A1c today 10.3% Pt was prescribed glimepiride 2 mg and actos 30 mg   Was on arb

## 2022-10-30 NOTE — Assessment & Plan Note (Addendum)
Pt was managed on amlodipine 5 mg and telmisartan 80 mg Pt stopped all medications 2/2 rash, which is still present despite stopping all medications

## 2022-10-31 ENCOUNTER — Encounter: Payer: Self-pay | Admitting: Physician Assistant

## 2022-11-12 ENCOUNTER — Other Ambulatory Visit (HOSPITAL_COMMUNITY): Payer: Self-pay

## 2022-11-12 ENCOUNTER — Telehealth: Payer: Self-pay

## 2022-11-12 NOTE — Telephone Encounter (Signed)
Tried reaching out to patient but got no answer no vm.    Mailed [employer] for Jardiance to patient home. I advise patient to fill out and return to office with proof of income.   I will be faxing prescriber portion to office. When pt return his portion please submit pt part with income documents with prescriber portion sign and filled out prescription part.   [employer] FAX NUMBER [phone]  Melanee Spry CPhT Rx Patient Advocate

## 2022-11-28 ENCOUNTER — Encounter: Payer: Self-pay | Admitting: Physician Assistant

## 2022-11-28 ENCOUNTER — Ambulatory Visit: Payer: PRIVATE HEALTH INSURANCE | Admitting: Physician Assistant

## 2022-11-28 VITALS — BP 139/106 | HR 94 | Temp 97.8°F | Wt 275.0 lb

## 2022-11-28 DIAGNOSIS — E1165 Type 2 diabetes mellitus with hyperglycemia: Secondary | ICD-10-CM | POA: Diagnosis not present

## 2022-11-28 DIAGNOSIS — I152 Hypertension secondary to endocrine disorders: Secondary | ICD-10-CM | POA: Diagnosis not present

## 2022-11-28 DIAGNOSIS — E1159 Type 2 diabetes mellitus with other circulatory complications: Secondary | ICD-10-CM

## 2022-11-28 MED ORDER — BENAZEPRIL HCL 10 MG PO TABS: 10.0000 mg | ORAL_TABLET | Freq: Every day | ORAL | 3 refills | Status: DC

## 2022-11-28 NOTE — Assessment & Plan Note (Signed)
Improvement but still elevated. Advised adding benazepril 10 mg back in, continue amlodipine 10 mg . Commended on weight loss and lifestyle changes F/u 4-6 weeks

## 2022-11-28 NOTE — Assessment & Plan Note (Addendum)
Pt has started Jardiance 25 mg. Reminded about assistance program.  Foot exam next visit. W/ new insurance in the new year encouraged eye exam  Commended on weight loss and lifestyle changes

## 2022-11-28 NOTE — Progress Notes (Signed)
I,Connie R Striblin,acting as a Education administrator for [employer], PA-C.,have documented all relevant documentation on the behalf of [name], PA-C,as directed by  [name], PA-C while in the presence of [name], PA-C.   Established patient visit   Patient: Melvin Mccormick   DOB: 01/23/1976   46 y.o. Male  MRN: [id number] Visit Date: 11/28/2022  Today's healthcare provider: [name], PA-C   Cc. DM, HTN f/u   Subjective    HPI  Diabetes Mellitus Type II, Follow-up  Lab Results  Component Value Date   HGBA1C 10.4 (A) 10/30/2022   HGBA1C 10.1 (H) 07/25/2022   HGBA1C 8.1 (H) 11/16/2021   Wt Readings from Last 3 Encounters:  11/28/22 275 lb (124.7 kg)  10/30/22 286 lb 12.8 oz (130.1 kg)  08/07/22 291 lb 8 oz (132.2 kg)   Last seen for diabetes 1 months ago.  Management since then includes monitoring at home. He reports excellent compliance with treatment. He is not having side effects.  Symptoms: No fatigue No foot ulcerations  No appetite changes No nausea  No paresthesia of the feet  No polydipsia  No polyuria No visual disturbances   No vomiting     Home blood sugar records:  not being taken.  Episodes of hypoglycemia? No    Most Recent Eye Exam: n/a Current exercise: strenuous job  Current diet habits: in general, a "healthy" diet    Pertinent Labs: Lab Results  Component Value Date   CHOL 166 11/16/2021   HDL 33 (L) 11/16/2021   LDLCALC 110 (H) 11/16/2021   TRIG 129 11/16/2021   CHOLHDL 5.0 11/16/2021   Lab Results  Component Value Date   NA 137 07/25/2022   K 4.7 07/25/2022   CREATININE 0.88 07/25/2022   EGFR 107 07/25/2022     ---------------------------------------------------------------------------------------------------   Hypertension, follow-up  BP Readings from Last 3 Encounters:  11/28/22 (!) 139/106  10/30/22 (!) 180/114  08/07/22 (!) 177/121   Wt Readings from Last 3 Encounters:  11/28/22 275 lb (124.7 kg)   10/30/22 286 lb 12.8 oz (130.1 kg)  08/07/22 291 lb 8 oz (132.2 kg)     He was last seen for hypertension 1 months ago.  BP at that visit was 180/114. Management since that visit includes monitoring at home.  He reports excellent compliance with treatment. He is not having side effects.  He is following a Low Sodium diet. He is exercising. He does not smoke.  Use of agents associated with hypertension: none.   Outside blood pressures are not being taken. Symptoms: No chest pain No chest pressure  No palpitations No syncope  No dyspnea No orthopnea  No paroxysmal nocturnal dyspnea No lower extremity edema   Pertinent labs Lab Results  Component Value Date   CHOL 166 11/16/2021   HDL 33 (L) 11/16/2021   LDLCALC 110 (H) 11/16/2021   TRIG 129 11/16/2021   CHOLHDL 5.0 11/16/2021   Lab Results  Component Value Date   NA 137 07/25/2022   K 4.7 07/25/2022   CREATININE 0.88 07/25/2022   EGFR 107 07/25/2022   GLUCOSE 288 (H) 07/25/2022   TSH 1.550 06/16/2019     The 10-year ASCVD risk score (Arnett DK, et al., 2019) is: 26.7%  ---------------------------------------------------------------------------------------------------    Medications: Outpatient Medications Prior to Visit  Medication Sig   amLODipine (NORVASC) 10 MG tablet Take 1 tablet (10 mg total) by mouth daily.   empagliflozin (JARDIANCE) 25 MG TABS tablet Take 1  tablet (25 mg total) by mouth daily before breakfast.   No facility-administered medications prior to visit.    Review of Systems  Constitutional:  Negative for fatigue and fever.  Respiratory:  Negative for cough and shortness of breath.   Cardiovascular:  Negative for chest pain, palpitations and leg swelling.  Neurological:  Negative for dizziness and headaches.      Objective    Blood pressure (!) 139/106, pulse 94, temperature 97.8 F (36.6 C), temperature source Oral, weight 275 lb (124.7 kg), SpO2 99 %.   Physical  Exam Constitutional:      General: He is awake.     Appearance: He is well-developed.  HENT:     Head: Normocephalic.  Eyes:     Conjunctiva/sclera: Conjunctivae normal.  Cardiovascular:     Rate and Rhythm: Normal rate and regular rhythm.     Heart sounds: Normal heart sounds.  Pulmonary:     Effort: Pulmonary effort is normal.  Skin:    General: Skin is warm.  Neurological:     Mental Status: He is alert and oriented to person, place, and time.  Psychiatric:        Attention and Perception: Attention normal.        Mood and Affect: Mood normal.        Speech: Speech normal.        Behavior: Behavior is cooperative.      No results found for any visits on 11/28/22.  Assessment & Plan     Problem List Items Addressed This Visit       Cardiovascular and Mediastinum   Hypertension associated with diabetes ([place]) - Primary    Improvement but still elevated. Advised adding benazepril 10 mg back in, continue amlodipine 10 mg . Commended on weight loss and lifestyle changes F/u 4-6 weeks      Relevant Medications   benazepril (LOTENSIN) 10 MG tablet     Endocrine   Type 2 diabetes mellitus with hyperglycemia ([facility])    Pt has started Jardiance 25 mg. Reminded about assistance program.  Foot exam next visit. W/ new insurance in the new year encouraged eye exam  Commended on weight loss and lifestyle changes       Relevant Medications   benazepril (LOTENSIN) 10 MG tablet     Return in about 4 weeks (around 12/26/2022) for hypertension.      I, [name], PA-C have reviewed all documentation for this visit. The documentation on  11/28/2022  for the exam, diagnosis, procedures, and orders are all accurate and complete.  [name], PA-C [facility] [address] [place], [place], [address] Office: [phone] Fax: [place]

## 2023-01-10 ENCOUNTER — Ambulatory Visit: Payer: PRIVATE HEALTH INSURANCE | Admitting: Physician Assistant

## 2023-01-10 ENCOUNTER — Encounter: Payer: Self-pay | Admitting: Physician Assistant

## 2023-01-10 VITALS — BP 149/94 | HR 96 | Ht 72.0 in | Wt 272.8 lb

## 2023-01-10 DIAGNOSIS — E1169 Type 2 diabetes mellitus with other specified complication: Secondary | ICD-10-CM

## 2023-01-10 DIAGNOSIS — E119 Type 2 diabetes mellitus without complications: Secondary | ICD-10-CM | POA: Insufficient documentation

## 2023-01-10 DIAGNOSIS — E785 Hyperlipidemia, unspecified: Secondary | ICD-10-CM

## 2023-01-10 DIAGNOSIS — E1165 Type 2 diabetes mellitus with hyperglycemia: Secondary | ICD-10-CM | POA: Diagnosis not present

## 2023-01-10 DIAGNOSIS — E1159 Type 2 diabetes mellitus with other circulatory complications: Secondary | ICD-10-CM | POA: Diagnosis not present

## 2023-01-10 DIAGNOSIS — I152 Hypertension secondary to endocrine disorders: Secondary | ICD-10-CM

## 2023-01-10 MED ORDER — BENAZEPRIL HCL 20 MG PO TABS: 20.0000 mg | ORAL_TABLET | Freq: Every day | ORAL | 1 refills | Status: DC

## 2023-01-10 NOTE — Assessment & Plan Note (Signed)
Ordered fasting lipids / cmp  Will need statin given uncontrolled DM and HTN

## 2023-01-10 NOTE — Assessment & Plan Note (Signed)
Slightly improvement Increase benazepril to 20 mg continue amlodipine 10 mg  F/u 4 weeks

## 2023-01-10 NOTE — Progress Notes (Signed)
I,[name],acting as a Education administrator for [employer], PA-C.,have documented all relevant documentation on the behalf of [name], PA-C,as directed by  [name], PA-C while in the presence of [name], PA-C.   Established patient visit   Patient: Melvin Mccormick   DOB: 1976-08-24   47 y.o. Male  MRN: [id number] Visit Date: 01/10/2023  Today's healthcare provider: [name], PA-C   Cc. HTN f/u   Subjective    HPI   Hypertension, follow-up  BP Readings from Last 3 Encounters:  01/10/23 (!) 149/94  11/28/22 (!) 139/106  10/30/22 (!) 180/114   Wt Readings from Last 3 Encounters:  01/10/23 272 lb 12.8 oz (123.7 kg)  11/28/22 275 lb (124.7 kg)  10/30/22 286 lb 12.8 oz (130.1 kg)     He was last seen for hypertension 8 weeks ago.  BP at that visit was 139/106. Management since that visit includes adding benazepril 10 mg back in, continue amlodipine 10 mg. .  Outside blood pressures are not being checked Symptoms: No chest pain No chest pressure  No palpitations No syncope  No dyspnea No orthopnea  No paroxysmal nocturnal dyspnea No lower extremity edema   Pertinent labs Lab Results  Component Value Date   CHOL 166 11/16/2021   HDL 33 (L) 11/16/2021   LDLCALC 110 (H) 11/16/2021   TRIG 129 11/16/2021   CHOLHDL 5.0 11/16/2021   Lab Results  Component Value Date   NA 137 07/25/2022   K 4.7 07/25/2022   CREATININE 0.88 07/25/2022   EGFR 107 07/25/2022   GLUCOSE 288 (H) 07/25/2022   TSH 1.550 06/16/2019     The 10-year ASCVD risk score (Arnett DK, et al., 2019) is: 29.8%  --------------------------------------------------------------------------------------------------- Medications: Outpatient Medications Prior to Visit  Medication Sig   amLODipine (NORVASC) 10 MG tablet Take 1 tablet (10 mg total) by mouth daily.   empagliflozin (JARDIANCE) 25 MG TABS tablet Take 1 tablet (25 mg total) by mouth daily before breakfast.   [DISCONTINUED]  benazepril (LOTENSIN) 10 MG tablet Take 1 tablet (10 mg total) by mouth daily.   No facility-administered medications prior to visit.    Review of Systems  Constitutional:  Negative for fatigue and fever.  Respiratory:  Negative for cough and shortness of breath.   Cardiovascular:  Negative for chest pain, palpitations and leg swelling.  Neurological:  Negative for dizziness and headaches.      Objective    Blood pressure (!) 149/94, pulse 96, height 6' (1.829 m), weight 272 lb 12.8 oz (123.7 kg), SpO2 100 %.   Physical Exam Vitals reviewed.  Constitutional:      Appearance: He is not ill-appearing.  HENT:     Head: Normocephalic.  Eyes:     Conjunctiva/sclera: Conjunctivae normal.  Cardiovascular:     Rate and Rhythm: Normal rate.  Pulmonary:     Effort: Pulmonary effort is normal. No respiratory distress.  Neurological:     General: No focal deficit present.     Mental Status: He is alert and oriented to person, place, and time.  Psychiatric:        Mood and Affect: Mood normal.        Behavior: Behavior normal.     No results found for any visits on 01/10/23.  Assessment & Plan     Problem List Items Addressed This Visit       Cardiovascular and Mediastinum   Hypertension associated with diabetes ([place]) - Primary  Slightly improvement Increase benazepril to 20 mg continue amlodipine 10 mg  F/u 4 weeks       Relevant Medications   benazepril (LOTENSIN) 20 MG tablet   Other Relevant Orders   Comprehensive Metabolic Panel (CMET)   Amb ref to Medical Nutrition Therapy-MNT     Endocrine   Type 2 diabetes mellitus with hyperglycemia ([facility])    Referred to dietician  Will check A1c next visit       Relevant Medications   benazepril (LOTENSIN) 20 MG tablet   Other Relevant Orders   Amb ref to Medical Nutrition Therapy-MNT   Hyperlipidemia associated with type 2 diabetes mellitus ([place])    Ordered fasting lipids / cmp  Will need statin given uncontrolled  DM and HTN      Relevant Medications   benazepril (LOTENSIN) 20 MG tablet   Other Relevant Orders   Lipid Profile   Comprehensive Metabolic Panel (CMET)   Amb ref to Medical Nutrition Therapy-MNT     Return in about 4 weeks (around 02/07/2023) for hypertension, DMII.     I, [name], PA-C have reviewed all documentation for this visit. The documentation on  01/10/23 for the exam, diagnosis, procedures, and orders are all accurate and complete.  [name], PA-C [facility] [address] [place], [place], [address] Office: [phone] Fax: [place]

## 2023-01-10 NOTE — Assessment & Plan Note (Signed)
Referred to dietician  Will check A1c next visit

## 2023-02-12 ENCOUNTER — Other Ambulatory Visit: Payer: Self-pay

## 2023-02-12 DIAGNOSIS — E1169 Type 2 diabetes mellitus with other specified complication: Secondary | ICD-10-CM

## 2023-02-12 LAB — COMPREHENSIVE METABOLIC PANEL
ALT: 22 IU/L (ref 0–44)
AST: 13 IU/L (ref 0–40)
Albumin/Globulin Ratio: 1.7 (ref 1.2–2.2)
Albumin: 4.7 g/dL (ref 4.1–5.1)
Alkaline Phosphatase: 108 IU/L (ref 44–121)
BUN/Creatinine Ratio: 16 (ref 9–20)
BUN: 18 mg/dL (ref 6–24)
Bilirubin Total: <0.2 mg/dL (ref 0.0–1.2)
CO2: 20 mmol/L (ref 20–29)
Calcium: 9.4 mg/dL (ref 8.7–10.2)
Chloride: 102 mmol/L (ref 96–106)
Creatinine, Ser: 1.14 mg/dL (ref 0.76–1.27)
Globulin, Total: 2.7 g/dL (ref 1.5–4.5)
Glucose: 175 mg/dL — ABNORMAL HIGH (ref 70–99)
Potassium: 4.7 mmol/L (ref 3.5–5.2)
Sodium: 139 mmol/L (ref 134–144)
Total Protein: 7.4 g/dL (ref 6.0–8.5)
eGFR: 80 mL/min/{1.73_m2} (ref >59)

## 2023-02-12 LAB — LIPID PANEL
Chol/HDL Ratio: 6.1 ratio — ABNORMAL HIGH (ref 0.0–5.0)
Cholesterol, Total: 177 mg/dL (ref 100–199)
HDL: 29 mg/dL — ABNORMAL LOW (ref >39)
LDL Chol Calc (NIH): 91 mg/dL (ref 0–99)
Triglycerides: 341 mg/dL — ABNORMAL HIGH (ref 0–149)
VLDL Cholesterol Cal: 57 mg/dL — ABNORMAL HIGH (ref 5–40)

## 2023-02-12 MED ORDER — ROSUVASTATIN CALCIUM 20 MG PO TABS: 20.0000 mg | ORAL_TABLET | Freq: Every day | ORAL | 3 refills | Status: DC

## 2023-02-14 ENCOUNTER — Encounter: Payer: Self-pay | Admitting: Physician Assistant

## 2023-02-14 ENCOUNTER — Ambulatory Visit: Payer: PRIVATE HEALTH INSURANCE | Admitting: Physician Assistant

## 2023-02-14 VITALS — BP 137/93 | HR 80 | Temp 97.9°F | Resp 16 | Wt 276.0 lb

## 2023-02-14 DIAGNOSIS — E1159 Type 2 diabetes mellitus with other circulatory complications: Secondary | ICD-10-CM | POA: Diagnosis not present

## 2023-02-14 DIAGNOSIS — E1165 Type 2 diabetes mellitus with hyperglycemia: Secondary | ICD-10-CM | POA: Diagnosis not present

## 2023-02-14 DIAGNOSIS — I152 Hypertension secondary to endocrine disorders: Secondary | ICD-10-CM | POA: Diagnosis not present

## 2023-02-14 DIAGNOSIS — E1169 Type 2 diabetes mellitus with other specified complication: Secondary | ICD-10-CM

## 2023-02-14 DIAGNOSIS — E785 Hyperlipidemia, unspecified: Secondary | ICD-10-CM

## 2023-02-14 LAB — POCT GLYCOSYLATED HEMOGLOBIN (HGB A1C): Hemoglobin A1C: 7.3 % — AB (ref 4.0–5.6)

## 2023-02-14 NOTE — Assessment & Plan Note (Signed)
Reminded pt to start statin

## 2023-02-14 NOTE — Progress Notes (Signed)
I,Sulibeya S Dimas,acting as a Education administrator for [employer], PA-C.,have documented all relevant documentation on the behalf of [name], PA-C,as directed by  [name], PA-C while in the presence of [name], PA-C.     Established patient visit   Patient: Melvin Mccormick   DOB: 11/03/1976   47 y.o. Male  MRN: [id number] Visit Date: 02/14/2023  Today's healthcare provider: [name], PA-C   Chief Complaint  Patient presents with   Diabetes   Hypertension   Hyperlipidemia   Subjective    HPI   Diabetes Mellitus Type II, follow-up  Lab Results  Component Value Date   HGBA1C 10.4 (A) 10/30/2022   HGBA1C 10.1 (H) 07/25/2022   HGBA1C 8.1 (H) 11/16/2021   Last seen for diabetes 1 months ago.  Management since then includes continuing the same treatment. He reports excellent compliance with treatment. He is not having side effects.   Home blood sugar records:  not being checked  Episodes of hypoglycemia? No    Current insulin regiment: none Most Recent Eye Exam:   --------------------------------------------------------------------------------------------------- Hypertension, follow-up  BP Readings from Last 3 Encounters:  02/14/23 (!) 137/93  01/10/23 (!) 149/94  11/28/22 (!) 139/106   Wt Readings from Last 3 Encounters:  02/14/23 276 lb (125.2 kg)  01/10/23 272 lb 12.8 oz (123.7 kg)  11/28/22 275 lb (124.7 kg)     He was last seen for hypertension 1 months ago.  BP at that visit was . Management since that visit includes .increase benazepril to 20 mg daily and continue amlodipine 10 mg daily He reports excellent compliance with treatment. He is not having side effects.   Outside blood pressures are not being checked.   Medications: Outpatient Medications Prior to Visit  Medication Sig   amLODipine (NORVASC) 10 MG tablet Take 1 tablet (10 mg total) by mouth daily.   benazepril (LOTENSIN) 20 MG tablet Take 1 tablet (20 mg total) by  mouth daily.   empagliflozin (JARDIANCE) 25 MG TABS tablet Take 1 tablet (25 mg total) by mouth daily before breakfast.   rosuvastatin (CRESTOR) 20 MG tablet Take 1 tablet (20 mg total) by mouth daily.   No facility-administered medications prior to visit.    Review of Systems  Eyes:  Negative for visual disturbance.  Respiratory:  Negative for chest tightness and shortness of breath.   Cardiovascular:  Negative for chest pain and leg swelling.  Neurological:  Negative for dizziness, light-headedness and headaches.     Objective    BP (!) 137/93 (BP Location: Right Arm, Patient Position: Sitting, Cuff Size: Large)   Pulse 80   Temp 97.9 F (36.6 C) (Temporal)   Resp 16   Wt 276 lb (125.2 kg)   SpO2 99%   BMI 37.43 kg/m    Physical Exam Vitals reviewed.  Constitutional:      Appearance: He is not ill-appearing.  HENT:     Head: Normocephalic.  Eyes:     Conjunctiva/sclera: Conjunctivae normal.  Cardiovascular:     Rate and Rhythm: Normal rate.     Pulses:          Dorsalis pedis pulses are 3+ on the right side and 3+ on the left side.       Posterior tibial pulses are 3+ on the right side and 3+ on the left side.  Pulmonary:     Effort: Pulmonary effort is normal. No respiratory distress.  Feet:     Right foot:  Protective Sensation: 4 sites tested.  4 sites sensed.     Skin integrity: Callus and dry skin present.     Toenail Condition: Right toenails are abnormally thick. Fungal disease present.    Left foot:     Protective Sensation: 4 sites tested.  4 sites sensed.     Skin integrity: Callus and dry skin present.     Toenail Condition: Left toenails are abnormally thick. Fungal disease present. Neurological:     General: No focal deficit present.     Mental Status: He is alert and oriented to person, place, and time.  Psychiatric:        Mood and Affect: Mood normal.        Behavior: Behavior normal.      No results found for any visits on 02/14/23.   Assessment & Plan     Problem List Items Addressed This Visit       Cardiovascular and Mediastinum   Hypertension associated with diabetes ([place])    Much better control in office No changes today, continue benzapril 20 mg and amlodipine 10 mg  F/u 3 mo         Endocrine   Type 2 diabetes mellitus with hyperglycemia (HCC) - Primary    A1c much improved from 10.4% to 7.3%  Manages with only jardiance 25 mg  Congratulated on effort, continue lifestyle changes Foot exam completed today Uacr ordered today Reminded pt to start statin , on ace  F/u 76mo      Relevant Orders   POCT glycosylated hemoglobin (Hb A1C)   Microalbumin / creatinine urine ratio   Hyperlipidemia associated with type 2 diabetes mellitus [place]    Reminded pt to start statin       Return in about 3 months (around 05/17/2023) for DMII, hypertension.      I, LMikey Kirschner PA-C have reviewed all documentation for this visit. The documentation on 02/14/23  for the exam, diagnosis, procedures, and orders are all accurate and complete.  LMikey Kirschner PA-C [facility] [place] [address]Office: [phone]: [phone]

## 2023-02-14 NOTE — Assessment & Plan Note (Signed)
Much better control in office No changes today, continue benzapril 20 mg and amlodipine 10 mg  F/u 3 mo

## 2023-02-14 NOTE — Assessment & Plan Note (Addendum)
A1c much improved from 10.4% to 7.3%  Manages with only jardiance 25 mg  Congratulated on effort, continue lifestyle changes Foot exam completed today Uacr ordered today Reminded pt to start statin , on ace  F/u 79mo

## 2023-05-15 ENCOUNTER — Other Ambulatory Visit: Payer: Self-pay | Admitting: Physician Assistant

## 2023-05-15 DIAGNOSIS — E1159 Type 2 diabetes mellitus with other circulatory complications: Secondary | ICD-10-CM

## 2023-05-19 ENCOUNTER — Ambulatory Visit: Payer: PRIVATE HEALTH INSURANCE | Admitting: Physician Assistant

## 2023-05-22 NOTE — Progress Notes (Signed)
I,Vanessa  Vital,acting as a Neurosurgeon for [employer], PA-C.,have documented all relevant documentation on the behalf of Alfredia Ferguson, PA-C,as directed by  Alfredia Ferguson, PA-C while in the presence of Alfredia Ferguson, PA-C.    Established patient visit   Patient: Melvin Mccormick   DOB: 06-01-1976   47 y.o. Male  MRN: [id number] Visit Date: 05/23/2023  Today's healthcare provider: Alfredia Ferguson, PA-C   Cc. Dm, htn  Subjective    HPI   Diabetes Mellitus Type II, Follow-up  Lab Results  Component Value Date   HGBA1C 7.5 (A) 05/23/2023   HGBA1C 7.3 (A) 02/14/2023   HGBA1C 10.4 (A) 10/30/2022   Wt Readings from Last 3 Encounters:  05/23/23 272 lb (123.4 kg)  02/14/23 276 lb (125.2 kg)  01/10/23 272 lb 12.8 oz (123.7 kg)   Last seen for diabetes 3 months ago.  Management since then includes no changes. He reports good compliance with treatment. He is not having side effects. Symptoms: No fatigue No foot ulcerations  No appetite changes No nausea  No paresthesia of the feet  No polydipsia  No polyuria No visual disturbances   No vomiting    Episodes of hypoglycemia? No  Current exercise: none Current diet habits: in general, an "unhealthy" diet  Pertinent Labs: Lab Results  Component Value Date   CHOL 177 02/11/2023   HDL 29 (L) 02/11/2023   LDLCALC 91 02/11/2023   TRIG 341 (H) 02/11/2023   CHOLHDL 6.1 (H) 02/11/2023   Lab Results  Component Value Date   NA 139 02/11/2023   K 4.7 02/11/2023   CREATININE 1.14 02/11/2023   EGFR 80 02/11/2023     ---------------------------------------------------------------------------------------------------  Hypertension, follow-up  BP Readings from Last 3 Encounters:  05/23/23 122/88  02/14/23 (!) 137/93  01/10/23 (!) 149/94   Wt Readings from Last 3 Encounters:  05/23/23 272 lb (123.4 kg)  02/14/23 276 lb (125.2 kg)  01/10/23 272 lb 12.8 oz (123.7 kg)     He was last seen for hypertension 3 months ago.   BP at that visit was 137/93  He reports good compliance with treatment. He is not having side effects.  He is following a Regular diet. He is not exercising. He does smoke.  Symptoms: No chest pain No chest pressure  No palpitations No syncope  No dyspnea No orthopnea  No paroxysmal nocturnal dyspnea No lower extremity edema   Pertinent labs Lab Results  Component Value Date   CHOL 177 02/11/2023   HDL 29 (L) 02/11/2023   LDLCALC 91 02/11/2023   TRIG 341 (H) 02/11/2023   CHOLHDL 6.1 (H) 02/11/2023   Lab Results  Component Value Date   NA 139 02/11/2023   K 4.7 02/11/2023   CREATININE 1.14 02/11/2023   EGFR 80 02/11/2023   GLUCOSE 175 (H) 02/11/2023   TSH 1.550 06/16/2019     The 10-year ASCVD risk score (Arnett DK, et al., 2019) is: 23.7%  ---------------------------------------------------------------------------------------------------   Medications: Outpatient Medications Prior to Visit  Medication Sig   amLODipine (NORVASC) 10 MG tablet TAKE 1 TABLET BY MOUTH DAILY   benazepril (LOTENSIN) 20 MG tablet Take 1 tablet (20 mg total) by mouth daily.   empagliflozin (JARDIANCE) 25 MG TABS tablet Take 1 tablet (25 mg total) by mouth daily before breakfast.   rosuvastatin (CRESTOR) 20 MG tablet Take 1 tablet (20 mg total) by mouth daily.   No facility-administered medications prior to visit.    Review of Systems  Constitutional:  Negative for fatigue and fever.  Respiratory:  Negative for cough and shortness of breath.   Cardiovascular:  Negative for chest pain, palpitations and leg swelling.  Neurological:  Negative for dizziness and headaches.       Objective    BP 122/88 (BP Location: Left Arm, Patient Position: Sitting, Cuff Size: Normal)   Pulse 79   Temp 98.2 F (36.8 C) (Oral)   Resp 15   Ht 6' (1.829 m)   Wt 272 lb (123.4 kg)   SpO2 99%   BMI 36.89 kg/m    Physical Exam Vitals reviewed.  Constitutional:      Appearance: He is not  ill-appearing.  HENT:     Head: Normocephalic.  Eyes:     Conjunctiva/sclera: Conjunctivae normal.  Cardiovascular:     Rate and Rhythm: Normal rate.  Pulmonary:     Effort: Pulmonary effort is normal. No respiratory distress.  Neurological:     General: No focal deficit present.     Mental Status: He is alert and oriented to person, place, and time.  Psychiatric:        Mood and Affect: Mood normal.        Behavior: Behavior normal.      Results for orders placed or performed in visit on 05/23/23  POCT glycosylated hemoglobin (Hb A1C)  Result Value Ref Range   Hemoglobin A1C 7.5 (A) 4.0 - 5.6 %   HbA1c POC (<> result, manual entry)     HbA1c, POC (prediabetic range)     HbA1c, POC (controlled diabetic range)      Assessment & Plan     Problem List Items Addressed This Visit       Cardiovascular and Mediastinum   Hypertension associated with diabetes (HCC)    Well controlled Continue benzapril 20 mg and amlodipine 10 mg  F/u 6 mo         Endocrine   Type 2 diabetes mellitus with hyperglycemia (HCC) - Primary    Last A1c 7.3% today 7.5%  Manages with jardiance 25 mg . Pt is not interested in another medication . Advised he needs to work on diet control, less carbs. Pt is agreeable. Foot exam, uacr utd. On statin, ace  F/u 4 mo       Relevant Orders   POCT glycosylated hemoglobin (Hb A1C) (Completed)     Return in about 4 months (around 09/22/2023) for DMII.      I, Alfredia Ferguson, PA-C have reviewed all documentation for this visit. The documentation on  05/23/23   for the exam, diagnosis, procedures, and orders are all accurate and complete.  Alfredia Ferguson, PA-C [facility] [address] [place], [place], [address] Office: [phone] Fax: [phone]   [facility]

## 2023-05-23 ENCOUNTER — Ambulatory Visit (INDEPENDENT_AMBULATORY_CARE_PROVIDER_SITE_OTHER): Payer: PRIVATE HEALTH INSURANCE | Admitting: Physician Assistant

## 2023-05-23 ENCOUNTER — Encounter: Payer: Self-pay | Admitting: Physician Assistant

## 2023-05-23 VITALS — BP 122/88 | HR 79 | Temp 98.2°F | Resp 15 | Ht 72.0 in | Wt 272.0 lb

## 2023-05-23 DIAGNOSIS — E1165 Type 2 diabetes mellitus with hyperglycemia: Secondary | ICD-10-CM

## 2023-05-23 DIAGNOSIS — I152 Hypertension secondary to endocrine disorders: Secondary | ICD-10-CM

## 2023-05-23 DIAGNOSIS — E1159 Type 2 diabetes mellitus with other circulatory complications: Secondary | ICD-10-CM

## 2023-05-23 LAB — POCT GLYCOSYLATED HEMOGLOBIN (HGB A1C): Hemoglobin A1C: 7.5 % — AB (ref 4.0–5.6)

## 2023-05-23 NOTE — Assessment & Plan Note (Signed)
Well controlled Continue benzapril 20 mg and amlodipine 10 mg  F/u 6 mo

## 2023-05-23 NOTE — Assessment & Plan Note (Addendum)
Last A1c 7.3% today 7.5%  Manages with jardiance 25 mg . Pt is not interested in another medication . Advised he needs to work on diet control, less carbs. Pt is agreeable. Foot exam, uacr utd. On statin, ace  F/u 4 mo

## 2023-08-11 ENCOUNTER — Other Ambulatory Visit: Payer: Self-pay | Admitting: Physician Assistant

## 2023-08-11 DIAGNOSIS — E1159 Type 2 diabetes mellitus with other circulatory complications: Secondary | ICD-10-CM

## 2023-09-26 ENCOUNTER — Ambulatory Visit: Payer: PRIVATE HEALTH INSURANCE | Admitting: Family Medicine

## 2023-09-29 ENCOUNTER — Other Ambulatory Visit: Payer: Self-pay | Admitting: Physician Assistant

## 2023-09-29 DIAGNOSIS — E119 Type 2 diabetes mellitus without complications: Secondary | ICD-10-CM

## 2023-10-28 ENCOUNTER — Ambulatory Visit: Payer: PRIVATE HEALTH INSURANCE | Admitting: Family Medicine

## 2023-11-07 ENCOUNTER — Ambulatory Visit: Payer: PRIVATE HEALTH INSURANCE | Admitting: Family Medicine

## 2023-11-28 ENCOUNTER — Ambulatory Visit (INDEPENDENT_AMBULATORY_CARE_PROVIDER_SITE_OTHER): Payer: PRIVATE HEALTH INSURANCE | Admitting: Family Medicine

## 2023-11-28 ENCOUNTER — Encounter: Payer: Self-pay | Admitting: Family Medicine

## 2023-11-28 VITALS — BP 156/88 | HR 89 | Ht 72.0 in | Wt 270.0 lb

## 2023-11-28 DIAGNOSIS — E1165 Type 2 diabetes mellitus with hyperglycemia: Secondary | ICD-10-CM

## 2023-11-28 DIAGNOSIS — L42 Pityriasis rosea: Secondary | ICD-10-CM | POA: Insufficient documentation

## 2023-11-28 DIAGNOSIS — Z125 Encounter for screening for malignant neoplasm of prostate: Secondary | ICD-10-CM | POA: Insufficient documentation

## 2023-11-28 DIAGNOSIS — Z1211 Encounter for screening for malignant neoplasm of colon: Secondary | ICD-10-CM | POA: Insufficient documentation

## 2023-11-28 DIAGNOSIS — E1159 Type 2 diabetes mellitus with other circulatory complications: Secondary | ICD-10-CM | POA: Diagnosis not present

## 2023-11-28 DIAGNOSIS — E1169 Type 2 diabetes mellitus with other specified complication: Secondary | ICD-10-CM

## 2023-11-28 DIAGNOSIS — I152 Hypertension secondary to endocrine disorders: Secondary | ICD-10-CM

## 2023-11-28 DIAGNOSIS — Z7984 Long term (current) use of oral hypoglycemic drugs: Secondary | ICD-10-CM

## 2023-11-28 DIAGNOSIS — E785 Hyperlipidemia, unspecified: Secondary | ICD-10-CM

## 2023-11-28 DIAGNOSIS — F17200 Nicotine dependence, unspecified, uncomplicated: Secondary | ICD-10-CM | POA: Insufficient documentation

## 2023-11-28 MED ORDER — BENAZEPRIL HCL 40 MG PO TABS: 40.0000 mg | ORAL_TABLET | Freq: Every day | ORAL | 3 refills | Status: AC

## 2023-11-28 MED ORDER — EMPAGLIFLOZIN 25 MG PO TABS: 25.0000 mg | ORAL_TABLET | Freq: Every day | ORAL | 3 refills | Status: DC

## 2023-11-28 MED ORDER — AMLODIPINE BESYLATE 10 MG PO TABS: 10.0000 mg | ORAL_TABLET | Freq: Every day | ORAL | 3 refills | Status: DC

## 2023-11-28 MED ORDER — ROSUVASTATIN CALCIUM 40 MG PO TABS: 40.0000 mg | ORAL_TABLET | Freq: Every day | ORAL | 3 refills | Status: AC

## 2023-11-28 NOTE — Assessment & Plan Note (Signed)
Denies LUTS; recommend PSA in place of DRE. If PSA is elevated for age, we will repeat; if PSA remains elevated pt will be referred to urology for DRE and next steps for best treatment.  

## 2023-11-28 NOTE — Progress Notes (Signed)
Established patient visit  Patient: Melvin Mccormick   DOB: 1976-01-12   47 y.o. Male  MRN: [id number] Visit Date: 11/28/2023  Today's healthcare provider: Jacky Kindle, FNP  Introduced to nurse practitioner role and practice setting.  All questions answered.  Discussed provider/patient relationship and expectations.  Chief Complaint  Patient presents with   Medical Management of Chronic Issues    4 month follow-up   Diabetes   Hypertension   Immunizations    Declined influenza and pneumococcal vaccine   Subjective    Diabetes  Hypertension   HPI     Medical Management of Chronic Issues    Additional comments: 4 month follow-up        Diabetes   Recent episode started more than a year ago.  Current treatment includes oral agent (monotherapy).  Compliance with treatment is excellent.  Blurred vision: Absent.  Chest pain: Absent.  Fatigue: Absent.  Foot Ulcerations: Absent.  Nausea: Absent.  Paresthesia of the feet: Absent.  Polydipsia: Absent.  Polyuria: Absent.  Visual changes: Absent.  Vomiting: Absent.  Weight loss: Absent.  Eye exam is not current.  Patient does not see a podiatrist.        Hypertension   Anxiety: Absent.  Blurred vision: Absent.  Chest pain: Absent.  Chest pressure/discomfort: Absent.  Dyspnea: Absent.  Headaches: Absent.  Lower extremity edema: Absent.  Orthopnea: Absent.  Palpitations: Absent.  Paroxysmal nocturnal dyspnea: Absent.  Syncope: Absent.        Immunizations    Additional comments: Declined influenza and pneumococcal vaccine      Last edited by Acey Lav, CMA on 11/28/2023  8:21 AM.      Medications: Outpatient Medications Prior to Visit  Medication Sig   [DISCONTINUED] amLODipine (NORVASC) 10 MG tablet TAKE 1 TABLET BY MOUTH DAILY   [DISCONTINUED] benazepril (LOTENSIN) 20 MG tablet TAKE 1 TABLET BY MOUTH DAILY   [DISCONTINUED] JARDIANCE 25 MG TABS tablet Take one tablet by mouth before breakfast   [DISCONTINUED]  rosuvastatin (CRESTOR) 20 MG tablet Take 1 tablet (20 mg total) by mouth daily.   No facility-administered medications prior to visit.     Objective    BP (!) 156/88 (BP Location: Left Arm, Patient Position: Sitting, Cuff Size: Normal)   Pulse 89   Ht 6' (1.829 m)   Wt 270 lb (122.5 kg)   SpO2 100%   BMI 36.62 kg/m   Physical Exam Vitals and nursing note reviewed.  Constitutional:      Appearance: Normal appearance. He is obese.  HENT:     Head: Normocephalic and atraumatic.  Eyes:     Comments: Encouraged to schedule DM eye exam  Cardiovascular:     Rate and Rhythm: Normal rate and regular rhythm.     Pulses: Normal pulses.     Heart sounds: Normal heart sounds.  Pulmonary:     Effort: Pulmonary effort is normal.     Breath sounds: Normal breath sounds.  Musculoskeletal:        General: Normal range of motion.     Cervical back: Normal range of motion.  Skin:    General: Skin is warm and dry.     Capillary Refill: Capillary refill takes less than 2 seconds.  Neurological:     General: No focal deficit present.     Mental Status: He is alert and oriented to person, place, and time. Mental status is at baseline.  Psychiatric:  Mood and Affect: Mood normal.        Behavior: Behavior normal.        Thought Content: Thought content normal.        Judgment: Judgment normal.     No results found for any visits on 11/28/23.  Assessment & Plan     Problem List Items Addressed This Visit       Cardiovascular and Mediastinum   Hypertension associated with diabetes (HCC)   Chronic, remains elevated Goal of 119/79 Increase benazepril from 20 to 40 mg  Continue norvasc 10 F/u with new PCP      Relevant Medications   amLODipine (NORVASC) 10 MG tablet   benazepril (LOTENSIN) 40 MG tablet   empagliflozin (JARDIANCE) 25 MG TABS tablet   rosuvastatin (CRESTOR) 40 MG tablet   Other Relevant Orders   Comprehensive metabolic panel   CBC with Differential/Platelet    TSH     Endocrine   Hyperlipidemia associated with type 2 diabetes mellitus (HCC)   Chronic, LDL goal remains <70 Repeat LP Remains on crestor 20 daily recommend diet low in saturated fat and regular exercise - 30 min at least 5 times per week       Relevant Medications   amLODipine (NORVASC) 10 MG tablet   benazepril (LOTENSIN) 40 MG tablet   empagliflozin (JARDIANCE) 25 MG TABS tablet   rosuvastatin (CRESTOR) 40 MG tablet   Other Relevant Orders   Lipid Panel With LDL/HDL Ratio   Type 2 diabetes mellitus with hyperglycemia (HCC) - Primary   Chronic, unknown Repeat A1c Encouraged DM eye and foot exam; pt declines Continues on jardiance 25 mg daily Recommend [facility]      Relevant Medications   benazepril (LOTENSIN) 40 MG tablet   empagliflozin (JARDIANCE) 25 MG TABS tablet   rosuvastatin (CRESTOR) 40 MG tablet   Other Relevant Orders   Urine Albumin/Creatinine with ratio (send out) [LAB689]   Comprehensive metabolic panel   Hemoglobin A1c     Musculoskeletal and Integument   Pityriasis rosea   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 If PSA is elevated for age, we will repeat; if PSA remains elevated pt will be referred to urology for DRE and next steps for best treatment.       Relevant Orders   PSA   Morbid obesity (HCC)   Body mass index is 36.62 kg/m. Associated with HTN, HLD T2DM Discussed importance of healthy weight management Discussed diet and exercise       Relevant Medications   empagliflozin (JARDIANCE) 25 MG TABS tablet   Other Relevant Orders   Vitamin D (25 hydroxy)   Tobacco dependence   Occasional use of "dip" pt reports no inhaled tobacco  in 3-4 years        Return in about 3 months (around 02/26/2024) for annual examination.     Leilani Merl, FNP, have reviewed all documentation for this visit. The documentation on 11/28/23 for the exam, diagnosis, procedures, and orders are all accurate and complete.  Jacky Kindle, FNP  [facility] [phone] (phone) [phone] (fax)  [facility]

## 2023-11-28 NOTE — Assessment & Plan Note (Signed)
Chronic, LDL goal remains <70 Repeat LP Remains on crestor 20 daily recommend diet low in saturated fat and regular exercise - 30 min at least 5 times per week

## 2023-11-28 NOTE — Assessment & Plan Note (Signed)
Chronic, remains elevated Goal of 119/79 Increase benazepril from 20 to 40 mg  Continue norvasc 10 F/u with new PCP

## 2023-11-28 NOTE — Assessment & Plan Note (Signed)
Occasional use of "dip" pt reports no inhaled tobacco in 3-4 years

## 2023-11-28 NOTE — Assessment & Plan Note (Signed)
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

## 2023-11-28 NOTE — Assessment & Plan Note (Addendum)
Chronic, unknown Repeat A1c Encouraged DM eye and foot exam; pt declines Continues on jardiance 25 mg daily Recommend [facility]

## 2023-11-28 NOTE — Assessment & Plan Note (Signed)
Body mass index is 36.62 kg/m. Associated with HTN, HLD T2DM Discussed importance of healthy weight management Discussed diet and exercise

## 2023-11-30 ENCOUNTER — Other Ambulatory Visit: Payer: Self-pay | Admitting: Family Medicine

## 2023-11-30 LAB — CBC WITH DIFFERENTIAL/PLATELET
Basophils Absolute: 0 10*3/uL (ref 0.0–0.2)
Basos: 1 %
EOS (ABSOLUTE): 0.3 10*3/uL (ref 0.0–0.4)
Eos: 6 %
Hematocrit: 46.6 % (ref 37.5–51.0)
Hemoglobin: 15 g/dL (ref 13.0–17.7)
Immature Grans (Abs): 0 10*3/uL (ref 0.0–0.1)
Immature Granulocytes: 0 %
Lymphocytes Absolute: 1.9 10*3/uL (ref 0.7–3.1)
Lymphs: 42 %
MCH: 29.1 pg (ref 26.6–33.0)
MCHC: 32.2 g/dL (ref 31.5–35.7)
MCV: 90 fL (ref 79–97)
Monocytes Absolute: 0.5 10*3/uL (ref 0.1–0.9)
Monocytes: 10 %
Neutrophils Absolute: 1.9 10*3/uL (ref 1.4–7.0)
Neutrophils: 41 %
Platelets: 168 10*3/uL (ref 150–450)
RBC: 5.16 x10E6/uL (ref 4.14–5.80)
RDW: 11.2 % — ABNORMAL LOW (ref 11.6–15.4)
WBC: 4.6 10*3/uL (ref 3.4–10.8)

## 2023-11-30 LAB — COMPREHENSIVE METABOLIC PANEL
ALT: 26 [IU]/L (ref 0–44)
AST: 18 [IU]/L (ref 0–40)
Albumin: 4.6 g/dL (ref 4.1–5.1)
Alkaline Phosphatase: 78 [IU]/L (ref 44–121)
BUN/Creatinine Ratio: 10 (ref 9–20)
BUN: 10 mg/dL (ref 6–24)
Bilirubin Total: 0.6 mg/dL (ref 0.0–1.2)
CO2: 23 mmol/L (ref 20–29)
Calcium: 9.4 mg/dL (ref 8.7–10.2)
Chloride: 102 mmol/L (ref 96–106)
Creatinine, Ser: 0.96 mg/dL (ref 0.76–1.27)
Globulin, Total: 2.7 g/dL (ref 1.5–4.5)
Glucose: 173 mg/dL — ABNORMAL HIGH (ref 70–99)
Potassium: 4.7 mmol/L (ref 3.5–5.2)
Sodium: 139 mmol/L (ref 134–144)
Total Protein: 7.3 g/dL (ref 6.0–8.5)
eGFR: 98 mL/min/{1.73_m2} (ref >59)

## 2023-11-30 LAB — LIPID PANEL WITH LDL/HDL RATIO
Cholesterol, Total: 88 mg/dL — ABNORMAL LOW (ref 100–199)
HDL: 30 mg/dL — ABNORMAL LOW (ref >39)
LDL Chol Calc (NIH): 44 mg/dL (ref 0–99)
LDL/HDL Ratio: 1.5 {ratio} (ref 0.0–3.6)
Triglycerides: 62 mg/dL (ref 0–149)
VLDL Cholesterol Cal: 14 mg/dL (ref 5–40)

## 2023-11-30 LAB — VITAMIN D 25 HYDROXY (VIT D DEFICIENCY, FRACTURES): Vit D, 25-Hydroxy: 24.9 ng/mL — ABNORMAL LOW (ref 30.0–100.0)

## 2023-11-30 LAB — TSH: TSH: 0.997 u[IU]/mL (ref 0.450–4.500)

## 2023-11-30 LAB — HEMOGLOBIN A1C
Est. average glucose Bld gHb Est-mCnc: 192 mg/dL
Hgb A1c MFr Bld: 8.3 % — ABNORMAL HIGH (ref 4.8–5.6)

## 2023-11-30 LAB — MICROALBUMIN / CREATININE URINE RATIO
Creatinine, Urine: 51.1 mg/dL
Microalb/Creat Ratio: 22 mg/g{creat} (ref 0–29)
Microalbumin, Urine: 11.4 ug/mL

## 2023-11-30 LAB — PSA: Prostate Specific Ag, Serum: 0.5 ng/mL (ref 0.0–4.0)

## 2023-11-30 MED ORDER — TIRZEPATIDE 5 MG/0.5ML ~~LOC~~ SOAJ: 5.0000 mg | SUBCUTANEOUS | 0 refills | Status: DC

## 2023-11-30 MED ORDER — METFORMIN HCL ER 500 MG PO TB24: 1000.0000 mg | ORAL_TABLET | Freq: Two times a day (BID) | ORAL | 0 refills | Status: DC

## 2024-02-27 ENCOUNTER — Encounter: Payer: Self-pay | Admitting: Family Medicine

## 2024-02-27 ENCOUNTER — Ambulatory Visit: Payer: PRIVATE HEALTH INSURANCE | Admitting: Family Medicine

## 2024-02-27 VITALS — HR 94 | Ht 73.0 in | Wt 268.0 lb

## 2024-02-27 DIAGNOSIS — Z7984 Long term (current) use of oral hypoglycemic drugs: Secondary | ICD-10-CM

## 2024-02-27 DIAGNOSIS — B351 Tinea unguium: Secondary | ICD-10-CM

## 2024-02-27 DIAGNOSIS — Z0000 Encounter for general adult medical examination without abnormal findings: Secondary | ICD-10-CM

## 2024-02-27 DIAGNOSIS — Z0001 Encounter for general adult medical examination with abnormal findings: Secondary | ICD-10-CM | POA: Diagnosis not present

## 2024-02-27 DIAGNOSIS — E1169 Type 2 diabetes mellitus with other specified complication: Secondary | ICD-10-CM

## 2024-02-27 DIAGNOSIS — E1142 Type 2 diabetes mellitus with diabetic polyneuropathy: Secondary | ICD-10-CM | POA: Diagnosis not present

## 2024-02-27 DIAGNOSIS — E1159 Type 2 diabetes mellitus with other circulatory complications: Secondary | ICD-10-CM

## 2024-02-27 DIAGNOSIS — E1165 Type 2 diabetes mellitus with hyperglycemia: Secondary | ICD-10-CM

## 2024-02-27 DIAGNOSIS — B353 Tinea pedis: Secondary | ICD-10-CM

## 2024-02-27 LAB — POCT GLYCOSYLATED HEMOGLOBIN (HGB A1C): Hemoglobin A1C: 8.2 % — AB (ref 4.0–5.6)

## 2024-02-27 MED ORDER — CICLOPIROX 8 % EX KIT: 1.0000 | PACK | Freq: Every day | CUTANEOUS | 1 refills | Status: AC

## 2024-02-27 MED ORDER — EMPAGLIFLOZIN 25 MG PO TABS: 25.0000 mg | ORAL_TABLET | Freq: Every day | ORAL | 3 refills | Status: AC

## 2024-02-27 MED ORDER — KETOCONAZOLE 2 % EX CREA: 1.0000 | TOPICAL_CREAM | Freq: Every day | CUTANEOUS | 0 refills | Status: AC

## 2024-02-27 MED ORDER — METFORMIN HCL ER 500 MG PO TB24: 500.0000 mg | ORAL_TABLET | Freq: Every day | ORAL | 2 refills | Status: DC

## 2024-02-27 NOTE — Progress Notes (Signed)
 Complete physical exam   Patient: Melvin Mccormick   DOB: December 20, 1975   48 y.o. Male  MRN: [id number] Visit Date: 02/27/2024  Today's healthcare provider: Sherlyn Hay, DO   Chief Complaint  Patient presents with   Establish Care    Provider transition   Annual Exam   Subjective    Melvin Mccormick is a 48 y.o. male who presents today for a complete physical exam.  He reports consuming a general diet. The patient has a physically strenuous job, but has no regular exercise apart from work.  He generally feels fairly well. He reports sleeping fairly well. He does not have additional problems to discuss today.  HPI HPI     Establish Care    Additional comments: Provider transition      Last edited by Sherlyn Hay, DO on 03/06/2024 10:17 PM.      Melvin Alstrom "Raynaldo Opitz" is a 48 year old male with diabetes who presents for an annual physical exam.  He has a history of diabetes with A1c levels consistently in the diabetic range since 2020, the most recent being 8.2. He experiences numbness and tingling in his feet, likely due to diabetic neuropathy, which he attributes to standing for long hours at work. He has been on various medications including Jardiance, which he recently ran out of. He previously tried metformin but experienced severe diarrhea and disliked the feeling it gave him. He has not been checking his blood sugars at home.  He sleeps five to six hours per night but does not feel rested. 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  He has a history of a rash and has been referred to dermatology for evaluation. He has dry skin and spots on his arms, which he has tried to treat with various over-the-counter products without success.  He is eligible for the pneumonia vaccine, although he declined it. He has not yet had a  colonoscopy but has previously used a Engineer, manufacturing. He has not had an eye exam recently. He engages in daily physical activity at work and does not consume a lot of fast food. He does not smoke cigarettes but uses snuff.   He occasionally experiences headaches when he does not eat and denies any double or blurred vision unless intoxicated. He uses Allegra for seasonal allergies but has not started it recently.   Past Medical History:  Diagnosis Date   Diabetes mellitus without complication (HCC)    Hypertension    History reviewed. No pertinent surgical history. Social History   Socioeconomic History   Marital status: Single    Spouse name: Not on file   Number of children: Not on file   Years of education: Not on file   Highest education level: Not on file  Occupational History   Not on file  Tobacco Use   Smoking status: Former    Current packs/day: 0.50    Average packs/day: 0.5 packs/day for 7.0 years (3.5 ttl pk-yrs)    Types: Cigarettes, E-cigarettes   Smokeless tobacco: Current    Types: Snuff  Vaping Use   Vaping status: Never Used  Substance and Sexual Activity   Alcohol use: Yes    Alcohol/week: 5.0 standard drinks of alcohol    Types: 5 Standard drinks or equivalent per week  Drug use: Yes    Types: Marijuana    Comment: every other day   Sexual activity: Not Currently    Partners: Female  Other Topics Concern   Not on file  Social History Narrative   Not on file   Social Drivers of Health   Financial Resource Strain: Not on file  Food Insecurity: Not on file  Transportation Needs: Not on file  Physical Activity: Not on file  Stress: Not on file  Social Connections: Not on file  Intimate Partner Violence: Not on file   Family Status  Relation Name Status   Mother  Alive   Father  Alive  No partnership data on file   History reviewed. No pertinent family history. Allergies  Allergen Reactions   Glimepiride Rash   Metformin And Related Other  (See Comments)    Severe diarrhea    Patient Care Team: Russie Gulledge N, DO as PCP - General (Family Medicine)   Medications: Outpatient Medications Prior to Visit  Medication Sig   amLODipine (NORVASC) 10 MG tablet Take 1 tablet (10 mg total) by mouth daily.   benazepril (LOTENSIN) 40 MG tablet Take 1 tablet (40 mg total) by mouth daily.   rosuvastatin (CRESTOR) 40 MG tablet Take 1 tablet (40 mg total) by mouth daily.   [DISCONTINUED] empagliflozin (JARDIANCE) 25 MG TABS tablet Take 1 tablet (25 mg total) by mouth daily.   [DISCONTINUED] metFORMIN (GLUCOPHAGE-XR) 500 MG 24 hr tablet Take 2 tablets (1,000 mg total) by mouth 2 (two) times daily with a meal. Given new start-- Take 1 tablet PO with dinner for 1 week. Next week, take 1 tablet PO with breakfast and dinner for 1 week. The third week, take 1 tablet PO with breakfast and 2 tablets PO with dinner for 1 week. Last week and continued use- take 2 tablets PO with breakfast and 2 tablets PO with dinner.   [DISCONTINUED] tirzepatide [facility]) 5 MG/0.5ML Pen Inject 5 mg into the skin once a week.   No facility-administered medications prior to visit.    Review of Systems  Constitutional:  Negative for appetite change, chills, fatigue and fever.  HENT:  Negative for congestion, ear pain, hearing loss, nosebleeds and trouble swallowing.   Eyes:  Negative for pain and visual disturbance.  Respiratory:  Negative for cough, chest tightness and shortness of breath.   Cardiovascular:  Negative for chest pain, palpitations and leg swelling.  Gastrointestinal:  Negative for abdominal pain, blood in stool, constipation, diarrhea, nausea and vomiting.  Endocrine: Negative for polydipsia, polyphagia and polyuria.  Genitourinary:  Negative for dysuria and flank pain.  Musculoskeletal:  Negative for arthralgias, back pain, joint swelling, myalgias and neck stiffness.  Skin:  Negative for color change, rash and wound.  Neurological:  Negative for  dizziness, tremors, seizures, speech difficulty, weakness, light-headedness and headaches.  Psychiatric/Behavioral:  Negative for behavioral problems, confusion, decreased concentration, dysphoric mood and sleep disturbance. The patient is not nervous/anxious.   All other systems reviewed and are negative.     Objective    Pulse 94   Ht 6\' 1"  (1.854 m)   Wt 268 lb (121.6 kg)   SpO2 92%   BMI 35.36 kg/m    Physical Exam Vitals and nursing note reviewed.  Constitutional:      General: He is awake.     Appearance: Normal appearance.  HENT:     Head: Normocephalic and atraumatic.     Right Ear: Tympanic membrane, ear canal and external ear  normal.     Left Ear: Tympanic membrane, ear canal and external ear normal.     Nose: Nose normal.     Mouth/Throat:     Mouth: Mucous membranes are moist.     Pharynx: Oropharynx is clear. No oropharyngeal exudate or posterior oropharyngeal erythema.  Eyes:     General: No scleral icterus.    Extraocular Movements: Extraocular movements intact.     Conjunctiva/sclera: Conjunctivae normal.     Pupils: Pupils are equal, round, and reactive to light.  Neck:     Thyroid: No thyromegaly or thyroid tenderness.  Cardiovascular:     Rate and Rhythm: Normal rate and regular rhythm.     Pulses: Normal pulses.     Heart sounds: Normal heart sounds.  Pulmonary:     Effort: Pulmonary effort is normal. No tachypnea, bradypnea or respiratory distress.     Breath sounds: Normal breath sounds. No stridor. No wheezing, rhonchi or rales.  Abdominal:     General: Bowel sounds are normal. There is no distension.     Palpations: Abdomen is soft. There is no mass.     Tenderness: There is no abdominal tenderness. There is no guarding.     Hernia: No hernia is present.  Musculoskeletal:     Cervical back: Normal range of motion and neck supple.     Right lower leg: No edema.     Left lower leg: No edema.  Lymphadenopathy:     Cervical: No cervical  adenopathy.  Skin:    General: Skin is warm and dry.  Neurological:     Mental Status: He is alert and oriented to person, place, and time. Mental status is at baseline.  Psychiatric:        Mood and Affect: Mood normal.        Behavior: Behavior normal.      Last depression screening scores    02/27/2024    9:27 AM 11/28/2023    8:28 AM 05/23/2023   10:56 AM  PHQ 2/9 Scores  PHQ - 2 Score 1 4 2   PHQ- 9 Score 1 6 5    Last fall risk screening    02/27/2024    9:37 AM  Fall Risk   Falls in the past year? 0  Number falls in past yr: 0  Injury with Fall? 0  Risk for fall due to : No Fall Risks   Last Audit-C alcohol use screening    02/27/2024    9:37 AM  Alcohol Use Disorder Test (AUDIT)  1. How often do you have a drink containing alcohol? 2  2. How many drinks containing alcohol do you have on a typical day when you are drinking? 2  3. How often do you have six or more drinks on one occasion? 2  AUDIT-C Score 6  4. How often during the last year have you found that you were not able to stop drinking once you had started? 0  5. How often during the last year have you failed to do what was normally expected from you because of drinking? 0  6. How often during the last year have you needed a first drink in the morning to get yourself going after a heavy drinking session? 0  7. How often during the last year have you had a feeling of guilt of remorse after drinking? 0  8. How often during the last year have you been unable to remember what happened the night before because you had  been drinking? 0  9. Have you or someone else been injured as a result of your drinking? 0  10. Has a relative or friend or a doctor or another health worker been concerned about your drinking or suggested you cut down? 2  Alcohol Use Disorder Identification Test Final Score (AUDIT) 8  Alcohol Brief Interventions/Follow-up Alcohol education/Brief advice   A score of 3 or more in women, and 4 or more  in men indicates increased risk for alcohol abuse, EXCEPT if all of the points are from question 1   Results for orders placed or performed in visit on 02/27/24  POCT glycosylated hemoglobin (Hb A1C)  Result Value Ref Range   Hemoglobin A1C 8.2 (A) 4.0 - 5.6 %   HbA1c POC (<> result, manual entry)     HbA1c, POC (prediabetic range)     HbA1c, POC (controlled diabetic range)      Assessment & Plan    Routine Health Maintenance and Physical Exam  Exercise Activities and Dietary recommendations  Goals   None     Immunization History  Administered Date(s) Administered   Tdap 03/21/2012, 01/01/2015    Health Maintenance  Topic Date Due   OPHTHALMOLOGY EXAM  Never done   INFLUENZA VACCINE  03/15/2024 (Originally 07/17/2023)   COVID-19 Vaccine (1 - 2024-25 season) 09/15/2024 (Originally 08/17/2023)   Pneumococcal Vaccine 84-24 Years old (1 of 2 - PCV) 02/26/2025 (Originally 02/15/1982)   Fecal DNA (Cologuard)  02/26/2025 (Originally 02/15/2021)   HEMOGLOBIN A1C  08/29/2024   Diabetic kidney evaluation - eGFR measurement  11/27/2024   Diabetic kidney evaluation - Urine ACR  11/27/2024   DTaP/Tdap/Td (3 - Td or Tdap) 01/01/2025   FOOT EXAM  02/26/2025   Hepatitis C Screening  Completed   HIV Screening  Completed   HPV VACCINES  Aged Out    Discussed health benefits of physical activity, and encouraged him to engage in regular exercise appropriate for his age and condition.   Annual physical exam Assessment & Plan: Physical exam overall unremarkable except as noted above. Routine lab work up to date. Eligible for pneumonia vaccine due to diabetes but declined. Due for colonoscopy and has not completed Cologuard kit. Recommended eye exam due to increased risk of diabetic retinopathy. Discussed alcohol consumption and advised to limit intake to two servings per occasion. Chews snuff and is not interested in quitting at this time. Discussed risks of oral cancers associated with snuff  use. - Recommend colonoscopy or reorder Cologuard kit. - Recommend eye exam for diabetic retinopathy screening. - Advise limiting alcohol intake to two servings per occasion. - Discuss risks of snuff use and encourage regular oral health checks.    Type 2 diabetes mellitus with diabetic polyneuropathy, without long-term current use of insulin (HCC) Assessment & Plan: Long-standing Type 2 Diabetes Mellitus with suboptimal control, indicated by an A1c today of 8.2%. Previous medications, including glipizide, pioglitazone, and glimepiride, were discontinued due to hypoglycemia risk and rash. Metformin was not tolerated due to severe diarrhea. [place] Pepper was recently started but ran out. Greggory Keen was not started due to preference against injections, as well as very high deductible for his medications. Discussed potential benefits of Mounjaro and Trulicity, including significant A1c reduction and weight loss, but he is hesitant about injections. Consider retrying extended-release metformin at a low dose to improve tolerance, as it may be better tolerated. Mounjaro and Trulicity require insurance coverage or patient assistance programs due to cost. - Prescribe extended-release metformin at the lowest dose  once daily. - Refill Jardiance prescription. - Consider restarting previous medications if metformin is not tolerated. - Discussed patient assistance programs for [facility] if insurance does not cover it.  Orders: -     POCT glycosylated hemoglobin (Hb A1C) -     Empagliflozin; Take 1 tablet (25 mg total) by mouth daily.  Dispense: 90 tablet; Refill: 3 -     metFORMIN HCl ER; Take 1 tablet (500 mg total) by mouth daily with breakfast.  Dispense: 30 tablet; Refill: 2  Morbid obesity (HCC) Assessment & Plan: Body mass index improved from 36.62 kg/m to 35.37 kg/m. Chronic, associated with HTN, HLD, T2DM Discussed importance of healthy weight management Discussed diet and exercise   Tinea pedis  of both feet Assessment & Plan: Suspected athlete's foot due to prolonged boot wear and dry feet. Reports using powders and sprays without relief. Discussed topical treatments to avoid systemic side effects of oral antifungals. - Prescribe ketoconazole cream for application on feet and between toes.  Orders: -     Ketoconazole; Apply 1 Application topically daily.  Dispense: 15 g; Refill: 0  Onychomycosis Assessment & Plan: Suspected fungal infection of the toenails due to prolonged boot wear and dry feet. Reports using powders and sprays without relief. Discussed topical treatments to avoid systemic side effects of oral antifungals. - Prescribe ciclopirox for toenail application.  Orders: -     Ciclopirox; Apply 1 Application topically at bedtime.  Dispense: 34.6 mL; Refill: 1    Return in about 3 months (around 05/29/2024) for DM, HTN.     I discussed the assessment and treatment plan with the patient  The patient was provided an opportunity to ask questions and all were answered. The patient agreed with the plan and demonstrated an understanding of the instructions.   The patient was advised to call back or seek an in-person evaluation if the symptoms worsen or if the condition fails to improve as anticipated.    Sherlyn Hay, DO  [facility] [phone] (phone) [phone] (fax)  [facility]

## 2024-03-06 ENCOUNTER — Encounter: Payer: Self-pay | Admitting: Family Medicine

## 2024-03-06 DIAGNOSIS — B353 Tinea pedis: Secondary | ICD-10-CM | POA: Insufficient documentation

## 2024-03-06 DIAGNOSIS — Z0000 Encounter for general adult medical examination without abnormal findings: Secondary | ICD-10-CM | POA: Insufficient documentation

## 2024-03-06 DIAGNOSIS — B351 Tinea unguium: Secondary | ICD-10-CM | POA: Insufficient documentation

## 2024-03-06 DIAGNOSIS — E1140 Type 2 diabetes mellitus with diabetic neuropathy, unspecified: Secondary | ICD-10-CM | POA: Insufficient documentation

## 2024-03-06 NOTE — Assessment & Plan Note (Signed)
 Long-standing Type 2 Diabetes Mellitus with suboptimal control, indicated by an A1c today of 8.2%. Previous medications, including glipizide, pioglitazone, and glimepiride, were discontinued due to hypoglycemia risk and rash. Metformin was not tolerated due to severe diarrhea. [place] Pepper was recently started but ran out. Greggory Keen was not started due to preference against injections, as well as very high deductible for his medications. Discussed potential benefits of Mounjaro and Trulicity, including significant A1c reduction and weight loss, but he is hesitant about injections. Consider retrying extended-release metformin at a low dose to improve tolerance, as it may be better tolerated. Mounjaro and Trulicity require insurance coverage or patient assistance programs due to cost. - Prescribe extended-release metformin at the lowest dose once daily. - Refill Jardiance prescription. - Consider restarting previous medications if metformin is not tolerated. - Discussed patient assistance programs for [facility] if insurance does not cover it.

## 2024-03-06 NOTE — Assessment & Plan Note (Signed)
 Suspected fungal infection of the toenails due to prolonged boot wear and dry feet. Reports using powders and sprays without relief. Discussed topical treatments to avoid systemic side effects of oral antifungals. - Prescribe ciclopirox for toenail application.

## 2024-03-06 NOTE — Assessment & Plan Note (Signed)
 Physical exam overall unremarkable except as noted above. Routine lab work up to date. Eligible for pneumonia vaccine due to diabetes but declined. Due for colonoscopy and has not completed Cologuard kit. Recommended eye exam due to increased risk of diabetic retinopathy. Discussed alcohol consumption and advised to limit intake to two servings per occasion. Chews snuff and is not interested in quitting at this time. Discussed risks of oral cancers associated with snuff use. - Recommend colonoscopy or reorder Cologuard kit. - Recommend eye exam for diabetic retinopathy screening. - Advise limiting alcohol intake to two servings per occasion. - Discuss risks of snuff use and encourage regular oral health checks.

## 2024-03-06 NOTE — Assessment & Plan Note (Signed)
 Body mass index improved from 36.62 kg/m to 35.37 kg/m. Chronic, associated with HTN, HLD, T2DM Discussed importance of healthy weight management Discussed diet and exercise

## 2024-03-06 NOTE — Assessment & Plan Note (Signed)
 Peripheral neuropathy likely secondary to diabetes, with symptoms of numbness and tingling in the feet. Symptoms currently manageable. Consider gabapentin in future. Continue to monitor.

## 2024-03-06 NOTE — Assessment & Plan Note (Addendum)
 Suspected athlete's foot due to prolonged boot wear and dry feet. Reports using powders and sprays without relief. Discussed topical treatments to avoid systemic side effects of oral antifungals. - Prescribe ketoconazole cream for application on feet and between toes.

## 2024-05-28 ENCOUNTER — Ambulatory Visit: Payer: PRIVATE HEALTH INSURANCE | Admitting: Family Medicine

## 2024-06-04 ENCOUNTER — Telehealth: Payer: Self-pay | Admitting: Family Medicine

## 2024-06-04 DIAGNOSIS — E1142 Type 2 diabetes mellitus with diabetic polyneuropathy: Secondary | ICD-10-CM

## 2024-06-04 MED ORDER — METFORMIN HCL ER 500 MG PO TB24: 500.0000 mg | ORAL_TABLET | Freq: Every day | ORAL | 1 refills | Status: AC

## 2024-06-04 NOTE — Telephone Encounter (Signed)
 Patient called stating he is out of Metformin  and this needs refilled please to [employer] [facility] [employer]

## 2024-06-04 NOTE — Telephone Encounter (Signed)
 Medication refilled.patient to keep his upcoming appointment

## 2024-07-02 ENCOUNTER — Encounter: Payer: Self-pay | Admitting: Family Medicine

## 2024-07-02 ENCOUNTER — Ambulatory Visit (INDEPENDENT_AMBULATORY_CARE_PROVIDER_SITE_OTHER): Payer: PRIVATE HEALTH INSURANCE | Admitting: Family Medicine

## 2024-07-02 VITALS — BP 139/89 | HR 81 | Ht 73.0 in | Wt 282.3 lb

## 2024-07-02 DIAGNOSIS — E1159 Type 2 diabetes mellitus with other circulatory complications: Secondary | ICD-10-CM

## 2024-07-02 DIAGNOSIS — L42 Pityriasis rosea: Secondary | ICD-10-CM

## 2024-07-02 DIAGNOSIS — R0789 Other chest pain: Secondary | ICD-10-CM

## 2024-07-02 DIAGNOSIS — I152 Hypertension secondary to endocrine disorders: Secondary | ICD-10-CM

## 2024-07-02 DIAGNOSIS — E1165 Type 2 diabetes mellitus with hyperglycemia: Secondary | ICD-10-CM | POA: Diagnosis not present

## 2024-07-02 DIAGNOSIS — E1169 Type 2 diabetes mellitus with other specified complication: Secondary | ICD-10-CM | POA: Diagnosis not present

## 2024-07-02 DIAGNOSIS — E785 Hyperlipidemia, unspecified: Secondary | ICD-10-CM

## 2024-07-02 MED ORDER — LANCET DEVICE MISC: 1.0000 | Freq: Every day | 0 refills | Status: DC

## 2024-07-02 MED ORDER — AMLODIPINE BESYLATE 10 MG PO TABS: 10.0000 mg | ORAL_TABLET | Freq: Every day | ORAL | 3 refills | Status: AC

## 2024-07-02 MED ORDER — BLOOD GLUCOSE MONITORING SUPPL DEVI: 1.0000 | Freq: Every day | 0 refills | Status: DC

## 2024-07-02 MED ORDER — BLOOD GLUCOSE TEST VI STRP: 1.0000 | ORAL_STRIP | Freq: Every day | 3 refills | Status: DC

## 2024-07-02 MED ORDER — LANCETS MISC. MISC: 1.0000 | Freq: Every day | 3 refills | Status: DC

## 2024-07-02 NOTE — Patient Instructions (Addendum)
 Side pain:  Take ibuprofen  200 mg tablets - take 3 tablets every 6 hours for 3-5 days.

## 2024-07-02 NOTE — Progress Notes (Signed)
 Established patient visit   Patient: Melvin Mccormick   DOB: December 15, 1976   48 y.o. Male  MRN: [id number] Visit Date: 07/02/2024  Today's healthcare provider: LAURAINE LOISE BUOY, DO   Chief Complaint  Patient presents with   Diabetes   Hypertension   Flank Pain    L side pain onset 2 days Pain when sitting, bending or getting up. Denies of sob. Otc: ibuprofen  a little help    Subjective    HPI Last annual exam: 02/27/2024   Melvin Mccormick is a 48 year old male with hypertension and diabetes who presents with left side pain.  He has been experiencing left side pain for a couple of weeks, described as a cramp that occurs with movements such as stretching, coughing, standing up, and getting into his truck. The pain has become more persistent over time, after a recent coughing episode in particular, but is not constant and does not occur while sitting still.  He has hypertension and has been making dietary changes to manage his blood pressure, including reducing fried foods, breads, and sodas, and consuming more water and natural juices. Despite these changes, he has gained weight, which he finds frustrating.  He is currently taking rosuvastatin  and benazepril  for his conditions. He was previously on Jardiance  but discontinued it due to cost and is now taking metformin , which he initially had trouble tolerating due to gastrointestinal issues but has since somewhat adjusted to. He is not currently monitoring his blood sugar levels at home as he does not have a glucose meter.  He recently had an eye exam where his eyes were dilated, which caused him difficulty driving home. He does not recall receiving a hepatitis B vaccine and is reluctant towards vaccinations without further research.      Medications: Outpatient Medications Prior to Visit  Medication Sig   benazepril  (LOTENSIN ) 40 MG tablet Take 1 tablet (40 mg total) by mouth daily.   Ciclopirox  8 % KIT Apply 1  Application topically at bedtime. (Patient not taking: Reported on 07/09/2024)   ketoconazole  (NIZORAL ) 2 % cream Apply 1 Application topically daily. (Patient not taking: Reported on 07/09/2024)   metFORMIN  (GLUCOPHAGE -XR) 500 MG 24 hr tablet Take 1 tablet (500 mg total) by mouth daily with breakfast.   rosuvastatin  (CRESTOR ) 40 MG tablet Take 1 tablet (40 mg total) by mouth daily.   [DISCONTINUED] amLODipine  (NORVASC ) 10 MG tablet Take 1 tablet (10 mg total) by mouth daily.   empagliflozin  (JARDIANCE ) 25 MG TABS tablet Take 1 tablet (25 mg total) by mouth daily. (Patient not taking: Reported on 07/09/2024)   No facility-administered medications prior to visit.        Objective    BP 139/89 (BP Location: Right Arm, Patient Position: Sitting, Cuff Size: Large)   Pulse 81   Ht 6' 1 (1.854 m)   Wt 282 lb 4.8 oz (128.1 kg)   SpO2 99%   BMI 37.24 kg/m     Physical Exam Vitals and nursing note reviewed.  Constitutional:      General: He is not in acute distress.    Appearance: Normal appearance.  HENT:     Head: Normocephalic and atraumatic.  Eyes:     General: No scleral icterus.    Conjunctiva/sclera: Conjunctivae normal.  Cardiovascular:     Rate and Rhythm: Normal rate.  Pulmonary:     Effort: Pulmonary effort is normal.  Neurological:     Mental Status: He is alert  and oriented to person, place, and time. Mental status is at baseline.  Psychiatric:        Mood and Affect: Mood normal.        Behavior: Behavior normal.      No results found for any visits on 07/02/24.  Assessment & Plan    Hypertension associated with diabetes (HCC) -     amLODIPine  Besylate; Take 1 tablet (10 mg total) by mouth daily.  Dispense: 90 tablet; Refill: 3  Type 2 diabetes mellitus with hyperglycemia, without long-term current use of insulin (HCC) -     AMB Referral VBCI Care Management  Hyperlipidemia associated with type 2 diabetes mellitus (HCC)  Left-sided chest wall  pain  Pityriasis rosea -     Ambulatory referral to Dermatology     Left-sided chest wall pain Intermittent pain likely musculoskeletal due to cramping and recent cough. - Ibuprofen  three tablets every six hours for three to five days. - Advise gentle stretching exercises. - Suggest heat or ice application as needed.  Hypertension Improved blood pressure control with lifestyle changes and medication. - Continue rosuvastatin , benazepril , and amlodipine . - Refill amlodipine  prescription.  Type 2 Diabetes Mellitus Patient unable to tolerate full dose of metformin -XR due to severe gastrointestinal side effects.  Jardiance  previously discontinued due to cost.  Will attempt to resume Jardiance  and will refer to pharmacy assistance team to see if patient qualifies for patient assistance program.  Not checking blood sugars at home. - Send glucose meter for home monitoring of fasting blood sugars. - Refer to pharmacy for patient assistance program for Jardiance . - Continue extended release metformin  until able to obtain Jardiance ; consider alternative if patient remains unable to fill Jardiance  prescription.  Hyperlipidemia Managed with rosuvastatin . Dietary changes ongoing. - Recheck cholesterol panel at the next visit.  General Health Maintenance Declined hepatitis B vaccination. Making lifestyle changes. - Discuss hepatitis B vaccination further if interested after conducting personal research.    Follow-up Monitoring ongoing health conditions. - Schedule follow-up appointment in late September or October. - Recheck A1c and vitamin D  levels at the next visit.  Return in about 3 months (around 10/02/2024).      I discussed the assessment and treatment plan with the patient  The patient was provided an opportunity to ask questions and all were answered. The patient agreed with the plan and demonstrated an understanding of the instructions.   The patient was advised to call back  or seek an in-person evaluation if the symptoms worsen or if the condition fails to improve as anticipated.    LAURAINE LOISE BUOY, DO  [facility] [phone] (phone) [phone] (fax)  [facility]

## 2024-07-05 ENCOUNTER — Telehealth: Payer: Self-pay

## 2024-07-05 NOTE — Progress Notes (Signed)
 Care Guide Pharmacy Note  07/05/2024 Name: Melvin Mccormick MRN: [id number] DOB: Feb 15, 1976  Referred By: Donzella Lauraine SAILOR, DO Reason for referral: Complex Care Management and Call Attempt #1 (Initial Outreach scheduled with PHARM D- Asajah for medication assistance. )   Melvin Mccormick is a 48 y.o. year old male who is a primary care patient of Donzella Lauraine SAILOR, DO.  Melvin Mccormick Melvin Mccormick was referred to the pharmacist for assistance related to: Medication Assistance.   Successful contact was made with the patient to discuss pharmacy services including being ready for the pharmacist to call at least 5 minutes before the scheduled appointment time and to have medication bottles and any blood pressure readings ready for review. The patient agreed to meet with the pharmacist via telephone visit on (date/time). 07/09/24 @ 9 AM.   Melvin Mccormick Melvin Mccormick  [facility], [facility] Guide  Direct Dial: [phone]  Fax [phone]

## 2024-07-09 ENCOUNTER — Other Ambulatory Visit: Payer: Self-pay

## 2024-07-09 DIAGNOSIS — I152 Hypertension secondary to endocrine disorders: Secondary | ICD-10-CM

## 2024-07-09 MED ORDER — BLOOD GLUCOSE MONITOR SYSTEM W/DEVICE KIT
1.0000 | PACK | Freq: Every day | 0 refills | Status: AC
  Filled 2024-07-09: qty 1, 30d supply, fill #0

## 2024-07-09 MED ORDER — TRUE METRIX BLOOD GLUCOSE TEST VI STRP
1.0000 | ORAL_STRIP | Freq: Every day | 3 refills | Status: AC
  Filled 2024-07-09: qty 50, 50d supply, fill #0

## 2024-07-09 MED ORDER — LANCET DEVICE MISC
1.0000 | Freq: Every day | 0 refills | Status: AC
  Filled 2024-07-09: qty 1, fill #0

## 2024-07-09 MED ORDER — TRUEPLUS LANCETS 28G MISC
1.0000 | Freq: Every day | 3 refills | Status: AC
  Filled 2024-07-09: qty 100, 100d supply, fill #0

## 2024-07-09 NOTE — Progress Notes (Signed)
 07/09/2024 Name: Melvin Mccormick MRN: [id number] DOB: 1976-03-23  Chief Complaint  Patient presents with   Medication Assistance    Melvin Mccormick is a 48 y.o. year old male who presented for a telephone visit.   They were referred to the pharmacist by their PCP for assistance in managing medication access.    Subjective:  Care Team: Primary Care Provider: Donzella Lauraine SAILOR, DO ; Next Scheduled Visit: 10/22/2024   Medication Access/Adherence  Current Pharmacy:  [facility] [id number] [place], [place] - [address] [place] [address] Phone: [phone] Fax: [phone]  [id number] [place], [place] - [address] AT [facility] OF [address] [place] [address] Phone: [phone] Fax: [phone]  [facility] - [place], [place] - [address] [place] [address] Phone: [phone] Fax: [phone]   Patient reports affordability concerns with their medications: Yes  Jardiance . He is insured but doesn't know the name of his insurance Patient reports access/transportation concerns to their pharmacy: No  Patient reports adherence concerns with their medications:  Yes  Cost   During medication review, the patient reported that he does not check his blood glucose because no one had previously instructed him to do so. He was reminded that a new prescription order for diabetic testing supplies was recently sent by his primary care provider (PCP).  Initially, the patient was unable to verify his prescription drug coverage. I contacted the pharmacy with the patient on the line and was informed that no prescription drug coverage is currently on file. The cost of his diabetic testing supplies was quoted at $47 each.  The patient has restarted metformin , although prior documentation indicates he experienced severe diarrhea with this medication. Based on the patient's reported  income last year, he does not qualify for any patient assistance programs. While he meets the income requirements for [employer], the diabetes grant is currently closed, and the patient remains uninsured.  I informed the patient that he may need to consider more affordable medication alternatives until prescription insurance is obtained.  I contacted a colleague for cost-effective diabetic supply options and was informed that at [facility], the following are available: TRUE METRIX test strips (50 count): $7.53 Lancets: $1.21 TRUE METRIX glucose meter: Free The patient expressed willingness to drive to this pharmacy to obtain the supplies.  Additionally, I followed up with [facility] (without the patient on the phone) and was informed that the patient had used a discount card to pay for a 90-day supply of the following medications: Amlodipine : $10.47 Metformin : $9.28 Benazepril : $9.99 Rosuvastatin : $27.25    Objective:  Lab Results  Component Value Date   HGBA1C 8.2 (A) 02/27/2024    Lab Results  Component Value Date   CREATININE 0.96 11/28/2023   BUN 10 11/28/2023   NA 139 11/28/2023   K 4.7 11/28/2023   CL 102 11/28/2023   CO2 23 11/28/2023    Lab Results  Component Value Date   CHOL 88 (L) 11/28/2023   HDL 30 (L) 11/28/2023   LDLCALC 44 11/28/2023   TRIG 62 11/28/2023   CHOLHDL 6.1 (H) 02/11/2023    Medications Reviewed Today     Reviewed by Cleatus Dorcas SAUNDERS, RPH (Pharmacist) on 07/09/24 at 0914  Med List Status: <None>   Medication Order Taking? Sig Documenting Provider Last Dose Status Informant  amLODipine  (NORVASC ) 10 MG  tablet [id number] Yes Take 1 tablet (10 mg total) by mouth daily. Donzella Lauraine SAILOR, DO  Active   benazepril  (LOTENSIN ) 40 MG tablet [id number] Yes Take 1 tablet (40 mg total) by mouth daily. Emilio Kelly DASEN, FNP  Active   Blood Glucose Monitoring Suppl DEVI [id number]  1 each by Does not apply route daily  before breakfast. May substitute to any manufacturer covered by patient's insurance.  Patient not taking: Reported on 07/09/2024   Donzella Lauraine SAILOR, DO  Active   Ciclopirox  8 % KIT [id number]  Apply 1 Application topically at bedtime.  Patient not taking: Reported on 07/09/2024   Pardue, Sarah N, DO  Active   empagliflozin  (JARDIANCE ) 25 MG TABS tablet [id number]  Take 1 tablet (25 mg total) by mouth daily.  Patient not taking: Reported on 07/09/2024   Pardue, Sarah N, DO  Active   Glucose Blood (BLOOD GLUCOSE TEST STRIPS) STRP [id number]  1 each by In Vitro route daily before breakfast. May substitute to any manufacturer covered by patient's insurance.  Patient not taking: Reported on 07/09/2024   Pardue, Sarah N, DO  Active   ketoconazole  (NIZORAL ) 2 % cream [id number]  Apply 1 Application topically daily.  Patient not taking: Reported on 07/09/2024   Donzella Lauraine SAILOR ROSALEA  Active   Lancet Device MISC [id number]  1 each by Does not apply route daily before breakfast. May substitute to any manufacturer covered by patient's insurance.  Patient not taking: Reported on 07/09/2024   Pardue, Sarah N, DO  Active   Lancets Misc. MISC [id number]  1 each by Does not apply route daily before breakfast. May substitute to any manufacturer covered by patient's insurance.  Patient not taking: Reported on 07/09/2024   Pardue, Sarah N, DO  Active   metFORMIN  (GLUCOPHAGE -XR) 500 MG 24 hr tablet [id number] Yes Take 1 tablet (500 mg total) by mouth daily with breakfast. Donzella Lauraine SAILOR, DO  Active   rosuvastatin  (CRESTOR ) 40 MG tablet [id number] Yes Take 1 tablet (40 mg total) by mouth daily. Emilio Kelly DASEN, FNP  Active               Assessment/Plan:  - Encouraged patient about open enrollment with employer (if available by company) or marketplace in November to better insurance coverage.  - Will collaborate with PCP to resend diabetic testing supplies to [facility] location  - Will  update PCP on medication assistance encounter from today   Follow Up Plan: 2 weeks (not scheduled, per patient's preference)   Dorcas Solian, PharmD Clinical Pharmacist Cell: [phone]

## 2024-07-10 ENCOUNTER — Other Ambulatory Visit: Payer: Self-pay

## 2024-07-12 ENCOUNTER — Encounter: Payer: Self-pay | Admitting: Family Medicine

## 2024-07-12 ENCOUNTER — Other Ambulatory Visit: Payer: Self-pay

## 2024-07-16 ENCOUNTER — Other Ambulatory Visit: Payer: Self-pay

## 2024-07-22 ENCOUNTER — Other Ambulatory Visit: Payer: Self-pay

## 2024-07-28 ENCOUNTER — Telehealth: Payer: Self-pay

## 2024-07-28 NOTE — Progress Notes (Signed)
   07/28/2024  Patient ID: Melvin Mccormick, male   DOB: 09/20/1976, 48 y.o.   MRN: [id number]  Called the patient to provide an update on his diabetic testing supplies. He asked that I call back in two hours, as it was not a good time to talk. The purpose of the outreach was to confirm whether he obtained the supplies and to discuss the cost.  Will send a MyChart message regarding the inquiry.  Dorcas Solian, PharmD Clinical Pharmacist Cell: [phone]

## 2024-07-29 ENCOUNTER — Ambulatory Visit: Payer: PRIVATE HEALTH INSURANCE | Admitting: Dermatology

## 2024-10-22 ENCOUNTER — Ambulatory Visit: Payer: PRIVATE HEALTH INSURANCE | Admitting: Family Medicine
